# Patient Record
Sex: Female | Born: 1955
Health system: Southern US, Community
[De-identification: ages and names within clinical notes are randomized; demographics above are authoritative.]

## PROBLEM LIST (undated history)

## (undated) DIAGNOSIS — E039 Hypothyroidism, unspecified: Secondary | ICD-10-CM

## (undated) DIAGNOSIS — H548 Legal blindness, as defined in USA: Secondary | ICD-10-CM

## (undated) DIAGNOSIS — N189 Chronic kidney disease, unspecified: Secondary | ICD-10-CM

## (undated) DIAGNOSIS — R7303 Prediabetes: Secondary | ICD-10-CM

## (undated) DIAGNOSIS — I890 Lymphedema, not elsewhere classified: Secondary | ICD-10-CM

## (undated) DIAGNOSIS — H3554 Dystrophies primarily involving the retinal pigment epithelium: Secondary | ICD-10-CM

## (undated) DIAGNOSIS — M1990 Unspecified osteoarthritis, unspecified site: Secondary | ICD-10-CM

## (undated) DIAGNOSIS — E785 Hyperlipidemia, unspecified: Secondary | ICD-10-CM

## (undated) DIAGNOSIS — I10 Essential (primary) hypertension: Secondary | ICD-10-CM

## (undated) HISTORY — PX: BREAST BIOPSY: SHX20

---

## 2007-01-03 ENCOUNTER — Ambulatory Visit: Payer: Self-pay | Admitting: Gastroenterology

## 2011-01-13 ENCOUNTER — Ambulatory Visit: Payer: Self-pay | Admitting: Obstetrics and Gynecology

## 2012-01-19 ENCOUNTER — Ambulatory Visit: Payer: Self-pay | Admitting: Obstetrics and Gynecology

## 2013-01-22 ENCOUNTER — Ambulatory Visit: Payer: Self-pay | Admitting: Obstetrics and Gynecology

## 2013-05-27 DIAGNOSIS — E039 Hypothyroidism, unspecified: Secondary | ICD-10-CM | POA: Insufficient documentation

## 2013-05-27 DIAGNOSIS — E1169 Type 2 diabetes mellitus with other specified complication: Secondary | ICD-10-CM | POA: Insufficient documentation

## 2013-07-24 DIAGNOSIS — I890 Lymphedema, not elsewhere classified: Secondary | ICD-10-CM | POA: Insufficient documentation

## 2014-01-23 ENCOUNTER — Ambulatory Visit: Payer: Self-pay | Admitting: Obstetrics and Gynecology

## 2014-07-28 DIAGNOSIS — E119 Type 2 diabetes mellitus without complications: Secondary | ICD-10-CM | POA: Diagnosis not present

## 2014-07-28 DIAGNOSIS — E782 Mixed hyperlipidemia: Secondary | ICD-10-CM | POA: Diagnosis not present

## 2014-07-28 DIAGNOSIS — Z23 Encounter for immunization: Secondary | ICD-10-CM | POA: Diagnosis not present

## 2014-07-28 DIAGNOSIS — Z79899 Other long term (current) drug therapy: Secondary | ICD-10-CM | POA: Diagnosis not present

## 2014-07-28 DIAGNOSIS — E039 Hypothyroidism, unspecified: Secondary | ICD-10-CM | POA: Diagnosis not present

## 2014-08-26 DIAGNOSIS — I10 Essential (primary) hypertension: Secondary | ICD-10-CM | POA: Diagnosis not present

## 2014-08-26 DIAGNOSIS — N183 Chronic kidney disease, stage 3 (moderate): Secondary | ICD-10-CM | POA: Diagnosis not present

## 2014-08-26 DIAGNOSIS — E119 Type 2 diabetes mellitus without complications: Secondary | ICD-10-CM | POA: Diagnosis not present

## 2014-11-17 DIAGNOSIS — N183 Chronic kidney disease, stage 3 (moderate): Secondary | ICD-10-CM | POA: Diagnosis not present

## 2014-11-17 DIAGNOSIS — E1122 Type 2 diabetes mellitus with diabetic chronic kidney disease: Secondary | ICD-10-CM | POA: Diagnosis not present

## 2014-11-17 DIAGNOSIS — I890 Lymphedema, not elsewhere classified: Secondary | ICD-10-CM | POA: Diagnosis not present

## 2014-11-17 DIAGNOSIS — I10 Essential (primary) hypertension: Secondary | ICD-10-CM | POA: Diagnosis not present

## 2015-01-13 ENCOUNTER — Other Ambulatory Visit: Payer: Self-pay | Admitting: Obstetrics and Gynecology

## 2015-01-13 DIAGNOSIS — Z1211 Encounter for screening for malignant neoplasm of colon: Secondary | ICD-10-CM | POA: Diagnosis not present

## 2015-01-13 DIAGNOSIS — Z1231 Encounter for screening mammogram for malignant neoplasm of breast: Secondary | ICD-10-CM

## 2015-01-13 DIAGNOSIS — Z01419 Encounter for gynecological examination (general) (routine) without abnormal findings: Secondary | ICD-10-CM | POA: Diagnosis not present

## 2015-01-19 DIAGNOSIS — Z780 Asymptomatic menopausal state: Secondary | ICD-10-CM | POA: Diagnosis not present

## 2015-01-27 DIAGNOSIS — E039 Hypothyroidism, unspecified: Secondary | ICD-10-CM | POA: Diagnosis not present

## 2015-01-27 DIAGNOSIS — I10 Essential (primary) hypertension: Secondary | ICD-10-CM | POA: Diagnosis not present

## 2015-01-27 DIAGNOSIS — Z79899 Other long term (current) drug therapy: Secondary | ICD-10-CM | POA: Diagnosis not present

## 2015-01-27 DIAGNOSIS — N183 Chronic kidney disease, stage 3 (moderate): Secondary | ICD-10-CM | POA: Diagnosis not present

## 2015-01-27 DIAGNOSIS — E1122 Type 2 diabetes mellitus with diabetic chronic kidney disease: Secondary | ICD-10-CM | POA: Insufficient documentation

## 2015-01-27 DIAGNOSIS — Z23 Encounter for immunization: Secondary | ICD-10-CM | POA: Diagnosis not present

## 2015-01-27 DIAGNOSIS — E782 Mixed hyperlipidemia: Secondary | ICD-10-CM | POA: Diagnosis not present

## 2015-01-28 ENCOUNTER — Ambulatory Visit
Admission: RE | Admit: 2015-01-28 | Discharge: 2015-01-28 | Disposition: A | Discharge status: Home / Self Care | Payer: Commercial Managed Care - HMO | Source: Ambulatory Visit | Attending: Obstetrics and Gynecology | Admitting: Obstetrics and Gynecology

## 2015-01-28 ENCOUNTER — Ambulatory Visit: Payer: Self-pay

## 2015-01-28 DIAGNOSIS — Z1231 Encounter for screening mammogram for malignant neoplasm of breast: Secondary | ICD-10-CM | POA: Diagnosis not present

## 2015-02-02 DIAGNOSIS — E782 Mixed hyperlipidemia: Secondary | ICD-10-CM | POA: Diagnosis not present

## 2015-02-02 DIAGNOSIS — I509 Heart failure, unspecified: Secondary | ICD-10-CM | POA: Diagnosis not present

## 2015-02-02 DIAGNOSIS — E1169 Type 2 diabetes mellitus with other specified complication: Secondary | ICD-10-CM | POA: Diagnosis not present

## 2015-02-02 DIAGNOSIS — H353290 Exudative age-related macular degeneration, unspecified eye, stage unspecified: Secondary | ICD-10-CM | POA: Diagnosis not present

## 2015-02-02 DIAGNOSIS — Z6832 Body mass index (BMI) 32.0-32.9, adult: Secondary | ICD-10-CM | POA: Diagnosis not present

## 2015-02-02 DIAGNOSIS — E039 Hypothyroidism, unspecified: Secondary | ICD-10-CM | POA: Diagnosis not present

## 2015-02-02 DIAGNOSIS — E669 Obesity, unspecified: Secondary | ICD-10-CM | POA: Diagnosis not present

## 2015-02-02 DIAGNOSIS — Z0000 Encounter for general adult medical examination without abnormal findings: Secondary | ICD-10-CM | POA: Diagnosis not present

## 2015-03-25 DIAGNOSIS — H3554 Dystrophies primarily involving the retinal pigment epithelium: Secondary | ICD-10-CM | POA: Diagnosis not present

## 2015-08-11 DIAGNOSIS — E782 Mixed hyperlipidemia: Secondary | ICD-10-CM | POA: Diagnosis not present

## 2015-08-11 DIAGNOSIS — E039 Hypothyroidism, unspecified: Secondary | ICD-10-CM | POA: Diagnosis not present

## 2015-08-11 DIAGNOSIS — Z79899 Other long term (current) drug therapy: Secondary | ICD-10-CM | POA: Diagnosis not present

## 2015-08-11 DIAGNOSIS — I10 Essential (primary) hypertension: Secondary | ICD-10-CM | POA: Diagnosis not present

## 2015-08-11 DIAGNOSIS — N183 Chronic kidney disease, stage 3 (moderate): Secondary | ICD-10-CM | POA: Diagnosis not present

## 2015-08-11 DIAGNOSIS — E1122 Type 2 diabetes mellitus with diabetic chronic kidney disease: Secondary | ICD-10-CM | POA: Diagnosis not present

## 2016-01-19 ENCOUNTER — Other Ambulatory Visit: Payer: Self-pay | Admitting: Obstetrics and Gynecology

## 2016-01-19 DIAGNOSIS — E781 Pure hyperglyceridemia: Secondary | ICD-10-CM | POA: Diagnosis not present

## 2016-01-19 DIAGNOSIS — Z1211 Encounter for screening for malignant neoplasm of colon: Secondary | ICD-10-CM | POA: Diagnosis not present

## 2016-01-19 DIAGNOSIS — E7800 Pure hypercholesterolemia, unspecified: Secondary | ICD-10-CM | POA: Diagnosis not present

## 2016-01-19 DIAGNOSIS — H547 Unspecified visual loss: Secondary | ICD-10-CM | POA: Diagnosis not present

## 2016-01-19 DIAGNOSIS — Z1231 Encounter for screening mammogram for malignant neoplasm of breast: Secondary | ICD-10-CM

## 2016-01-19 DIAGNOSIS — Z01419 Encounter for gynecological examination (general) (routine) without abnormal findings: Secondary | ICD-10-CM | POA: Diagnosis not present

## 2016-01-19 DIAGNOSIS — E669 Obesity, unspecified: Secondary | ICD-10-CM | POA: Diagnosis not present

## 2016-02-16 ENCOUNTER — Ambulatory Visit
Admission: RE | Admit: 2016-02-16 | Discharge: 2016-02-16 | Disposition: A | Discharge status: Home / Self Care | Payer: Commercial Managed Care - HMO | Source: Ambulatory Visit | Attending: Obstetrics and Gynecology | Admitting: Obstetrics and Gynecology

## 2016-02-16 DIAGNOSIS — Z79899 Other long term (current) drug therapy: Secondary | ICD-10-CM | POA: Diagnosis not present

## 2016-02-16 DIAGNOSIS — N183 Chronic kidney disease, stage 3 (moderate): Secondary | ICD-10-CM | POA: Diagnosis not present

## 2016-02-16 DIAGNOSIS — E782 Mixed hyperlipidemia: Secondary | ICD-10-CM | POA: Diagnosis not present

## 2016-02-16 DIAGNOSIS — Z1231 Encounter for screening mammogram for malignant neoplasm of breast: Secondary | ICD-10-CM | POA: Insufficient documentation

## 2016-02-16 DIAGNOSIS — I10 Essential (primary) hypertension: Secondary | ICD-10-CM | POA: Diagnosis not present

## 2016-02-16 DIAGNOSIS — E1122 Type 2 diabetes mellitus with diabetic chronic kidney disease: Secondary | ICD-10-CM | POA: Diagnosis not present

## 2016-02-16 DIAGNOSIS — E039 Hypothyroidism, unspecified: Secondary | ICD-10-CM | POA: Diagnosis not present

## 2016-08-01 DIAGNOSIS — H353122 Nonexudative age-related macular degeneration, left eye, intermediate dry stage: Secondary | ICD-10-CM | POA: Diagnosis not present

## 2016-08-15 DIAGNOSIS — R748 Abnormal levels of other serum enzymes: Secondary | ICD-10-CM | POA: Diagnosis not present

## 2016-08-15 DIAGNOSIS — I10 Essential (primary) hypertension: Secondary | ICD-10-CM | POA: Diagnosis not present

## 2016-08-15 DIAGNOSIS — E039 Hypothyroidism, unspecified: Secondary | ICD-10-CM | POA: Diagnosis not present

## 2016-08-15 DIAGNOSIS — E782 Mixed hyperlipidemia: Secondary | ICD-10-CM | POA: Diagnosis not present

## 2016-08-15 DIAGNOSIS — Z23 Encounter for immunization: Secondary | ICD-10-CM | POA: Diagnosis not present

## 2016-08-15 DIAGNOSIS — E1122 Type 2 diabetes mellitus with diabetic chronic kidney disease: Secondary | ICD-10-CM | POA: Diagnosis not present

## 2016-08-15 DIAGNOSIS — N183 Chronic kidney disease, stage 3 (moderate): Secondary | ICD-10-CM | POA: Diagnosis not present

## 2016-08-15 DIAGNOSIS — I890 Lymphedema, not elsewhere classified: Secondary | ICD-10-CM | POA: Diagnosis not present

## 2016-08-15 DIAGNOSIS — Z79899 Other long term (current) drug therapy: Secondary | ICD-10-CM | POA: Diagnosis not present

## 2016-09-05 DIAGNOSIS — H353122 Nonexudative age-related macular degeneration, left eye, intermediate dry stage: Secondary | ICD-10-CM | POA: Diagnosis not present

## 2016-11-09 DIAGNOSIS — Z1211 Encounter for screening for malignant neoplasm of colon: Secondary | ICD-10-CM | POA: Diagnosis not present

## 2017-02-10 ENCOUNTER — Encounter: Payer: Self-pay | Admitting: *Deleted

## 2017-02-13 ENCOUNTER — Encounter
Admission: RE | Disposition: A | Discharge status: Home / Self Care | Payer: Self-pay | Source: Ambulatory Visit | Attending: Unknown Physician Specialty

## 2017-02-13 ENCOUNTER — Ambulatory Visit: Payer: Medicare HMO | Admitting: Anesthesiology

## 2017-02-13 ENCOUNTER — Encounter: Payer: Self-pay | Admitting: Anesthesiology

## 2017-02-13 ENCOUNTER — Ambulatory Visit
Admission: RE | Admit: 2017-02-13 | Discharge: 2017-02-13 | Disposition: A | Discharge status: Home / Self Care | Payer: Medicare HMO | Source: Ambulatory Visit | Attending: Unknown Physician Specialty | Admitting: Unknown Physician Specialty

## 2017-02-13 DIAGNOSIS — E039 Hypothyroidism, unspecified: Secondary | ICD-10-CM | POA: Diagnosis not present

## 2017-02-13 DIAGNOSIS — Z7982 Long term (current) use of aspirin: Secondary | ICD-10-CM | POA: Diagnosis not present

## 2017-02-13 DIAGNOSIS — E785 Hyperlipidemia, unspecified: Secondary | ICD-10-CM | POA: Diagnosis not present

## 2017-02-13 DIAGNOSIS — I129 Hypertensive chronic kidney disease with stage 1 through stage 4 chronic kidney disease, or unspecified chronic kidney disease: Secondary | ICD-10-CM | POA: Diagnosis not present

## 2017-02-13 DIAGNOSIS — Z79899 Other long term (current) drug therapy: Secondary | ICD-10-CM | POA: Diagnosis not present

## 2017-02-13 DIAGNOSIS — Z1211 Encounter for screening for malignant neoplasm of colon: Secondary | ICD-10-CM | POA: Diagnosis not present

## 2017-02-13 DIAGNOSIS — I890 Lymphedema, not elsewhere classified: Secondary | ICD-10-CM | POA: Diagnosis not present

## 2017-02-13 DIAGNOSIS — K640 First degree hemorrhoids: Secondary | ICD-10-CM | POA: Diagnosis not present

## 2017-02-13 DIAGNOSIS — H3554 Dystrophies primarily involving the retinal pigment epithelium: Secondary | ICD-10-CM | POA: Diagnosis not present

## 2017-02-13 DIAGNOSIS — N183 Chronic kidney disease, stage 3 (moderate): Secondary | ICD-10-CM | POA: Insufficient documentation

## 2017-02-13 DIAGNOSIS — K648 Other hemorrhoids: Secondary | ICD-10-CM | POA: Diagnosis not present

## 2017-02-13 DIAGNOSIS — E1122 Type 2 diabetes mellitus with diabetic chronic kidney disease: Secondary | ICD-10-CM | POA: Insufficient documentation

## 2017-02-13 DIAGNOSIS — H548 Legal blindness, as defined in USA: Secondary | ICD-10-CM | POA: Diagnosis not present

## 2017-02-13 HISTORY — DX: Legal blindness, as defined in USA: H54.8

## 2017-02-13 HISTORY — DX: Hypothyroidism, unspecified: E03.9

## 2017-02-13 HISTORY — DX: Lymphedema, not elsewhere classified: I89.0

## 2017-02-13 HISTORY — DX: Essential (primary) hypertension: I10

## 2017-02-13 HISTORY — PX: COLONOSCOPY WITH PROPOFOL: SHX5780

## 2017-02-13 HISTORY — DX: Hyperlipidemia, unspecified: E78.5

## 2017-02-13 HISTORY — DX: Prediabetes: R73.03

## 2017-02-13 HISTORY — DX: Chronic kidney disease, unspecified: N18.9

## 2017-02-13 HISTORY — DX: Dystrophies primarily involving the retinal pigment epithelium: H35.54

## 2017-02-13 SURGERY — COLONOSCOPY WITH PROPOFOL
Anesthesia: General

## 2017-02-13 MED ORDER — SODIUM CHLORIDE 0.9 % IV SOLN
INTRAVENOUS | Status: DC
  Administered 2017-02-13: 07:00:00 via INTRAVENOUS

## 2017-02-13 MED ORDER — SODIUM CHLORIDE 0.9 % IV SOLN: INTRAVENOUS | Status: DC

## 2017-02-13 MED ORDER — PROPOFOL 500 MG/50ML IV EMUL
INTRAVENOUS | Status: AC
  Filled 2017-02-13: qty 50

## 2017-02-13 MED ORDER — FENTANYL CITRATE (PF) 100 MCG/2ML IJ SOLN
INTRAMUSCULAR | Status: DC | PRN
  Administered 2017-02-13: 50 ug via INTRAVENOUS

## 2017-02-13 MED ORDER — FENTANYL CITRATE (PF) 100 MCG/2ML IJ SOLN
INTRAMUSCULAR | Status: AC
  Filled 2017-02-13: qty 2

## 2017-02-13 MED ORDER — EPHEDRINE SULFATE 50 MG/ML IJ SOLN
INTRAMUSCULAR | Status: DC | PRN
  Administered 2017-02-13: 10 mg via INTRAVENOUS

## 2017-02-13 MED ORDER — MIDAZOLAM HCL 2 MG/2ML IJ SOLN
INTRAMUSCULAR | Status: AC
  Filled 2017-02-13: qty 2

## 2017-02-13 MED ORDER — MIDAZOLAM HCL 2 MG/2ML IJ SOLN
INTRAMUSCULAR | Status: DC | PRN
  Administered 2017-02-13: 2 mg via INTRAVENOUS

## 2017-02-13 MED ORDER — PROPOFOL 500 MG/50ML IV EMUL
INTRAVENOUS | Status: DC | PRN
  Administered 2017-02-13: 100 ug/kg/min via INTRAVENOUS

## 2017-02-13 NOTE — Anesthesia Postprocedure Evaluation (Signed)
Anesthesia Post Note  Patient: Amy Meyer  Procedure(Meyer) Performed: COLONOSCOPY WITH PROPOFOL (N/A )  Patient location during evaluation: Endoscopy Anesthesia Type: General Level of consciousness: awake and alert Pain management: pain level controlled Vital Signs Assessment: post-procedure vital signs reviewed and stable Respiratory status: spontaneous breathing, nonlabored ventilation, respiratory function stable and patient connected to nasal cannula oxygen Cardiovascular status: blood pressure returned to baseline and stable Postop Assessment: no apparent nausea or vomiting Anesthetic complications: no     Last Vitals:  Vitals:   02/13/17 0818 02/13/17 0828  BP: 138/63 135/62  Pulse: (!) 58 (!) 54  Resp: 17 14  Temp:    SpO2: 100% 99%    Last Pain:  Vitals:   02/13/17 0758  TempSrc: Tympanic                 Amy Meyer

## 2017-02-13 NOTE — Anesthesia Post-op Follow-up Note (Signed)
Anesthesia QCDR form completed.        

## 2017-02-13 NOTE — Anesthesia Preprocedure Evaluation (Signed)
Anesthesia Evaluation  Patient identified by MRN, date of birth, ID band Patient awake    Reviewed: Allergy & Precautions, NPO status , Patient's Chart, lab work & pertinent test results, reviewed documented beta blocker date and time   Airway Mallampati: II  TM Distance: >3 FB     Dental  (+) Chipped   Pulmonary           Cardiovascular hypertension, Pt. on medications      Neuro/Psych    GI/Hepatic   Endo/Other  diabetes, Type 2Hypothyroidism   Renal/GU Renal disease     Musculoskeletal   Abdominal   Peds  Hematology   Anesthesia Other Findings Legally blind.  Reproductive/Obstetrics                             Anesthesia Physical Anesthesia Plan  ASA: III  Anesthesia Plan: General   Post-op Pain Management:    Induction: Intravenous  PONV Risk Score and Plan:   Airway Management Planned:   Additional Equipment:   Intra-op Plan:   Post-operative Plan:   Informed Consent: I have reviewed the patients History and Physical, chart, labs and discussed the procedure including the risks, benefits and alternatives for the proposed anesthesia with the patient or authorized representative who has indicated his/her understanding and acceptance.     Plan Discussed with: CRNA  Anesthesia Plan Comments:         Anesthesia Quick Evaluation

## 2017-02-13 NOTE — Op Note (Signed)
[facility] Gastroenterology Patient Name: Amy Meyer Procedure Date: 02/13/2017 7:29 AM MRN: [id number] Account #: [id number] Date of Birth: 1955-03-07 Admit Type: Outpatient Age: [age] [facility] Gender: Female Note Status: Finalized Procedure:            Colonoscopy Indications:          Screening for colorectal malignant neoplasm Providers:            Scot Junobert T. Elliott, MD Referring MD:         Neomia Dearavid N. Harrington Challengerhies, MD (Referring MD) Medicines:            Propofol per Anesthesia Complications:        No immediate complications. Procedure:            Pre-Anesthesia Assessment:                       - After reviewing the risks and benefits, the patient                        was deemed in satisfactory condition to undergo the                        procedure.                       After obtaining informed consent, the colonoscope was                        passed under direct vision. Throughout the procedure,                        the patient's blood pressure, pulse, and oxygen                        saturations were monitored continuously. The                        Colonoscope was introduced through the anus and                        advanced to the the cecum, identified by appendiceal                        orifice and ileocecal valve. The colonoscopy was                        performed without difficulty. The patient tolerated the                        procedure well. The quality of the bowel preparation                        was excellent. Findings:      Internal hemorrhoids were found during endoscopy. The hemorrhoids were       small and Grade I (internal hemorrhoids that do not prolapse).      The exam was otherwise without abnormality. Prep was excellent. Impression:           - Internal hemorrhoids.                       -  The examination was otherwise normal.                       - No specimens collected. Recommendation:       - Repeat  colonoscopy in 10 years for screening purposes. Procedure Code(s):    --- Professional ---                       [phone], Colonoscopy, flexible; diagnostic, including                        collection of specimen(s) by brushing or washing, when                        performed (separate procedure) Diagnosis Code(s):    --- Professional ---                       Z12.11, Encounter for screening for malignant neoplasm                        of colon                       K64.0, First degree hemorrhoids CPT copyright 2016 American Medical Association. All rights reserved. The codes documented in this report are preliminary and upon coder review may  be revised to meet current compliance requirements. Scot Junobert T Elliott, MD 02/13/2017 7:56:32 AM This report has been signed electronically. Number of Addenda: 0 Note Initiated On: 02/13/2017 7:29 AM Scope Withdrawal Time: 0 hours 9 minutes 19 seconds  Total Procedure Duration: 0 hours 17 minutes 27 seconds       [facility]

## 2017-02-13 NOTE — Anesthesia Procedure Notes (Signed)
Performed by: Tonia Ghentook-Martin, Verner Kopischke Pre-anesthesia Checklist: Emergency Drugs available, Patient identified, Patient being monitored, Suction available and Timeout performed Patient Re-evaluated:Patient Re-evaluated prior to induction Oxygen Delivery Method: Nasal cannula Preoxygenation: Pre-oxygenation with 100% oxygen Induction Type: IV induction Placement Confirmation: CO2 detector and positive ETCO2

## 2017-02-13 NOTE — Transfer of Care (Signed)
Immediate Anesthesia Transfer of Care Note  Patient: Amy Meyer  Procedure(s) Performed: COLONOSCOPY WITH PROPOFOL (N/A )  Patient Location: PACU  Anesthesia Type:General  Level of Consciousness: awake and sedated  Airway & Oxygen Therapy: Patient Spontanous Breathing and Patient connected to nasal cannula oxygen  Post-op Assessment: Report given to RN and Post -op Vital signs reviewed and stable  Post vital signs: Reviewed and stable  Last Vitals:  Vitals:   02/13/17 0654  BP: (!) 144/61  Pulse: 73  Resp: 20  Temp: (!) 35.6 C  SpO2: 99%    Last Pain:  Vitals:   02/13/17 0654  TempSrc: Tympanic         Complications: No apparent anesthesia complications

## 2017-02-13 NOTE — H&P (Signed)
   Primary Care Physician:  Mickey Farberhies, David, MD Primary Gastroenterologist:  Dr. Mechele CollinElliott  Pre-Procedure History & Physical: HPI:  Amy Meyer is a 61 y.o. female is here for an colonoscopy.   Past Medical History:  Diagnosis Date  . Adult onset vitelliform macular dystrophy   . Chronic kidney disease    stage 3 chronic kidney disease  . Hyperlipidemia   . Hypertension   . Hypothyroidism   . Legally blind   . Lymphedema   . Pre-diabetes     Past Surgical History:  Procedure Laterality Date  . BREAST BIOPSY Right    bx/clip-neg    Prior to Admission medications   Medication Sig Start Date End Date Taking? Authorizing Provider  aspirin 325 MG EC tablet Take 325 mg by mouth daily.   Yes [provider]  atorvastatin (LIPITOR) 40 MG tablet Take 40 mg by mouth daily.   Yes [provider]  hydrochlorothiazide (HYDRODIURIL) 25 MG tablet Take 25 mg by mouth daily.   Yes [provider]  levothyroxine (SYNTHROID, LEVOTHROID) 88 MCG tablet Take 88 mcg by mouth daily before breakfast.   Yes [provider]    Allergies as of 02/01/2017  . (Not on File)    Family History  Problem Relation Age of Onset  . Breast cancer Paternal Aunt 2960    Social History   Socioeconomic History  . Marital status: Married    Spouse name: Not on file  . Number of children: Not on file  . Years of education: Not on file  . Highest education level: Not on file  Social Needs  . Financial resource strain: Not on file  . Food insecurity - worry: Not on file  . Food insecurity - inability: Not on file  . Transportation needs - medical: Not on file  . Transportation needs - non-medical: Not on file  Occupational History  . Not on file  Tobacco Use  . Smoking status: Never Smoker  . Smokeless tobacco: Never Used  Substance and Sexual Activity  . Alcohol use: No    Frequency: Never  . Drug use: No  . Sexual activity: Not on file  Other Topics  Concern  . Not on file  Social History Narrative  . Not on file    Review of Systems: See HPI, otherwise negative ROS  Physical Exam: BP (!) 144/61   Pulse 73   Temp (!) 96.1 F (35.6 C) (Tympanic)   Resp 20   Ht 5\' 5"  (1.651 m)   Wt 95.3 kg (210 lb)   SpO2 99%   BMI 34.95 kg/m  General:   Alert,  pleasant and cooperative in NAD Head:  Normocephalic and atraumatic. Neck:  Supple; no masses or thyromegaly. Lungs:  Clear throughout to auscultation.    Heart:  Regular rate and rhythm. Abdomen:  Soft, nontender and nondistended. Normal bowel sounds, without guarding, and without rebound.   Neurologic:  Alert and  oriented x4;  grossly normal neurologically.  Impression/Plan: Amy Meyer is here for an colonoscopy to be performed for screening colon exam.  Risks, benefits, limitations, and alternatives regarding  colonoscopy have been reviewed with the patient.  Questions have been answered.  All parties agreeable.   [name], Nijae Doyel, MD  02/13/2017, 7:22 AM

## 2017-02-15 ENCOUNTER — Encounter: Payer: Self-pay | Admitting: Unknown Physician Specialty

## 2017-02-16 DIAGNOSIS — H3554 Dystrophies primarily involving the retinal pigment epithelium: Secondary | ICD-10-CM | POA: Diagnosis not present

## 2017-02-16 DIAGNOSIS — E039 Hypothyroidism, unspecified: Secondary | ICD-10-CM | POA: Diagnosis not present

## 2017-02-16 DIAGNOSIS — E782 Mixed hyperlipidemia: Secondary | ICD-10-CM | POA: Diagnosis not present

## 2017-02-16 DIAGNOSIS — Z79899 Other long term (current) drug therapy: Secondary | ICD-10-CM | POA: Diagnosis not present

## 2017-02-16 DIAGNOSIS — N183 Chronic kidney disease, stage 3 (moderate): Secondary | ICD-10-CM | POA: Diagnosis not present

## 2017-02-16 DIAGNOSIS — I10 Essential (primary) hypertension: Secondary | ICD-10-CM | POA: Diagnosis not present

## 2017-02-16 DIAGNOSIS — E1122 Type 2 diabetes mellitus with diabetic chronic kidney disease: Secondary | ICD-10-CM | POA: Diagnosis not present

## 2017-02-16 DIAGNOSIS — Z0000 Encounter for general adult medical examination without abnormal findings: Secondary | ICD-10-CM | POA: Diagnosis not present

## 2017-08-18 DIAGNOSIS — E782 Mixed hyperlipidemia: Secondary | ICD-10-CM | POA: Diagnosis not present

## 2017-08-18 DIAGNOSIS — E1122 Type 2 diabetes mellitus with diabetic chronic kidney disease: Secondary | ICD-10-CM | POA: Diagnosis not present

## 2017-08-18 DIAGNOSIS — Z23 Encounter for immunization: Secondary | ICD-10-CM | POA: Diagnosis not present

## 2017-08-18 DIAGNOSIS — H3554 Dystrophies primarily involving the retinal pigment epithelium: Secondary | ICD-10-CM | POA: Diagnosis not present

## 2017-08-18 DIAGNOSIS — N183 Chronic kidney disease, stage 3 (moderate): Secondary | ICD-10-CM | POA: Diagnosis not present

## 2017-08-18 DIAGNOSIS — E039 Hypothyroidism, unspecified: Secondary | ICD-10-CM | POA: Diagnosis not present

## 2017-08-18 DIAGNOSIS — Z79899 Other long term (current) drug therapy: Secondary | ICD-10-CM | POA: Diagnosis not present

## 2017-08-18 DIAGNOSIS — I10 Essential (primary) hypertension: Secondary | ICD-10-CM | POA: Diagnosis not present

## 2017-08-18 DIAGNOSIS — I890 Lymphedema, not elsewhere classified: Secondary | ICD-10-CM | POA: Diagnosis not present

## 2017-10-31 ENCOUNTER — Other Ambulatory Visit: Payer: Self-pay | Admitting: Obstetrics and Gynecology

## 2017-10-31 DIAGNOSIS — E669 Obesity, unspecified: Secondary | ICD-10-CM | POA: Diagnosis not present

## 2017-10-31 DIAGNOSIS — Z1231 Encounter for screening mammogram for malignant neoplasm of breast: Secondary | ICD-10-CM

## 2017-10-31 DIAGNOSIS — Z01419 Encounter for gynecological examination (general) (routine) without abnormal findings: Secondary | ICD-10-CM | POA: Diagnosis not present

## 2017-10-31 DIAGNOSIS — Z124 Encounter for screening for malignant neoplasm of cervix: Secondary | ICD-10-CM | POA: Diagnosis not present

## 2017-10-31 DIAGNOSIS — Z1211 Encounter for screening for malignant neoplasm of colon: Secondary | ICD-10-CM | POA: Diagnosis not present

## 2017-11-08 ENCOUNTER — Ambulatory Visit: Payer: Medicare HMO

## 2017-11-09 ENCOUNTER — Ambulatory Visit
Admission: RE | Admit: 2017-11-09 | Discharge: 2017-11-09 | Disposition: A | Discharge status: Home / Self Care | Payer: Medicare HMO | Source: Ambulatory Visit | Attending: Obstetrics and Gynecology | Admitting: Obstetrics and Gynecology

## 2017-11-09 DIAGNOSIS — Z1231 Encounter for screening mammogram for malignant neoplasm of breast: Secondary | ICD-10-CM | POA: Diagnosis not present

## 2017-11-16 DIAGNOSIS — E119 Type 2 diabetes mellitus without complications: Secondary | ICD-10-CM | POA: Diagnosis not present

## 2018-02-19 DIAGNOSIS — E785 Hyperlipidemia, unspecified: Secondary | ICD-10-CM | POA: Diagnosis not present

## 2018-02-19 DIAGNOSIS — E039 Hypothyroidism, unspecified: Secondary | ICD-10-CM | POA: Diagnosis not present

## 2018-02-19 DIAGNOSIS — Z79899 Other long term (current) drug therapy: Secondary | ICD-10-CM | POA: Diagnosis not present

## 2018-02-19 DIAGNOSIS — I10 Essential (primary) hypertension: Secondary | ICD-10-CM | POA: Diagnosis not present

## 2018-02-19 DIAGNOSIS — I890 Lymphedema, not elsewhere classified: Secondary | ICD-10-CM | POA: Diagnosis not present

## 2018-02-19 DIAGNOSIS — E1169 Type 2 diabetes mellitus with other specified complication: Secondary | ICD-10-CM | POA: Diagnosis not present

## 2018-02-19 DIAGNOSIS — N183 Chronic kidney disease, stage 3 (moderate): Secondary | ICD-10-CM | POA: Diagnosis not present

## 2018-02-19 DIAGNOSIS — E559 Vitamin D deficiency, unspecified: Secondary | ICD-10-CM | POA: Insufficient documentation

## 2018-02-19 DIAGNOSIS — E1122 Type 2 diabetes mellitus with diabetic chronic kidney disease: Secondary | ICD-10-CM | POA: Diagnosis not present

## 2018-02-19 DIAGNOSIS — E1159 Type 2 diabetes mellitus with other circulatory complications: Secondary | ICD-10-CM | POA: Diagnosis not present

## 2018-08-20 DIAGNOSIS — E559 Vitamin D deficiency, unspecified: Secondary | ICD-10-CM | POA: Diagnosis not present

## 2018-08-20 DIAGNOSIS — E785 Hyperlipidemia, unspecified: Secondary | ICD-10-CM | POA: Diagnosis not present

## 2018-08-20 DIAGNOSIS — M5416 Radiculopathy, lumbar region: Secondary | ICD-10-CM | POA: Diagnosis not present

## 2018-08-20 DIAGNOSIS — N183 Chronic kidney disease, stage 3 (moderate): Secondary | ICD-10-CM | POA: Diagnosis not present

## 2018-08-20 DIAGNOSIS — Z79899 Other long term (current) drug therapy: Secondary | ICD-10-CM | POA: Diagnosis not present

## 2018-08-20 DIAGNOSIS — Z0000 Encounter for general adult medical examination without abnormal findings: Secondary | ICD-10-CM | POA: Diagnosis not present

## 2018-08-20 DIAGNOSIS — E039 Hypothyroidism, unspecified: Secondary | ICD-10-CM | POA: Diagnosis not present

## 2018-08-20 DIAGNOSIS — E1159 Type 2 diabetes mellitus with other circulatory complications: Secondary | ICD-10-CM | POA: Diagnosis not present

## 2018-08-20 DIAGNOSIS — E1122 Type 2 diabetes mellitus with diabetic chronic kidney disease: Secondary | ICD-10-CM | POA: Diagnosis not present

## 2018-08-20 DIAGNOSIS — E1169 Type 2 diabetes mellitus with other specified complication: Secondary | ICD-10-CM | POA: Diagnosis not present

## 2018-08-29 DIAGNOSIS — E1122 Type 2 diabetes mellitus with diabetic chronic kidney disease: Secondary | ICD-10-CM | POA: Diagnosis not present

## 2018-08-29 DIAGNOSIS — G8929 Other chronic pain: Secondary | ICD-10-CM | POA: Diagnosis not present

## 2018-08-29 DIAGNOSIS — M545 Low back pain: Secondary | ICD-10-CM | POA: Diagnosis not present

## 2018-08-29 DIAGNOSIS — N183 Chronic kidney disease, stage 3 (moderate): Secondary | ICD-10-CM | POA: Diagnosis not present

## 2018-08-29 DIAGNOSIS — M1612 Unilateral primary osteoarthritis, left hip: Secondary | ICD-10-CM | POA: Diagnosis not present

## 2018-08-29 DIAGNOSIS — M5442 Lumbago with sciatica, left side: Secondary | ICD-10-CM | POA: Diagnosis not present

## 2018-08-29 DIAGNOSIS — E669 Obesity, unspecified: Secondary | ICD-10-CM | POA: Diagnosis not present

## 2018-08-30 DIAGNOSIS — M25552 Pain in left hip: Secondary | ICD-10-CM | POA: Diagnosis not present

## 2018-08-30 DIAGNOSIS — M1612 Unilateral primary osteoarthritis, left hip: Secondary | ICD-10-CM | POA: Diagnosis not present

## 2018-11-06 DIAGNOSIS — Z124 Encounter for screening for malignant neoplasm of cervix: Secondary | ICD-10-CM | POA: Diagnosis not present

## 2018-11-06 DIAGNOSIS — Z1231 Encounter for screening mammogram for malignant neoplasm of breast: Secondary | ICD-10-CM | POA: Diagnosis not present

## 2018-11-12 ENCOUNTER — Other Ambulatory Visit: Payer: Self-pay | Admitting: Obstetrics and Gynecology

## 2018-11-12 DIAGNOSIS — Z1231 Encounter for screening mammogram for malignant neoplasm of breast: Secondary | ICD-10-CM

## 2018-11-19 DIAGNOSIS — E119 Type 2 diabetes mellitus without complications: Secondary | ICD-10-CM | POA: Diagnosis not present

## 2018-11-20 ENCOUNTER — Ambulatory Visit
Admission: RE | Admit: 2018-11-20 | Discharge: 2018-11-20 | Disposition: A | Discharge status: Home / Self Care | Payer: Medicare HMO | Source: Ambulatory Visit | Attending: Obstetrics and Gynecology | Admitting: Obstetrics and Gynecology

## 2018-11-20 ENCOUNTER — Other Ambulatory Visit: Payer: Self-pay

## 2018-11-20 DIAGNOSIS — Z1231 Encounter for screening mammogram for malignant neoplasm of breast: Secondary | ICD-10-CM | POA: Diagnosis not present

## 2019-02-21 DIAGNOSIS — E1122 Type 2 diabetes mellitus with diabetic chronic kidney disease: Secondary | ICD-10-CM | POA: Diagnosis not present

## 2019-02-21 DIAGNOSIS — E039 Hypothyroidism, unspecified: Secondary | ICD-10-CM | POA: Diagnosis not present

## 2019-02-21 DIAGNOSIS — E559 Vitamin D deficiency, unspecified: Secondary | ICD-10-CM | POA: Diagnosis not present

## 2019-02-21 DIAGNOSIS — I890 Lymphedema, not elsewhere classified: Secondary | ICD-10-CM | POA: Diagnosis not present

## 2019-02-21 DIAGNOSIS — E785 Hyperlipidemia, unspecified: Secondary | ICD-10-CM | POA: Diagnosis not present

## 2019-02-21 DIAGNOSIS — E1169 Type 2 diabetes mellitus with other specified complication: Secondary | ICD-10-CM | POA: Diagnosis not present

## 2019-02-21 DIAGNOSIS — E1159 Type 2 diabetes mellitus with other circulatory complications: Secondary | ICD-10-CM | POA: Diagnosis not present

## 2019-02-21 DIAGNOSIS — Z79899 Other long term (current) drug therapy: Secondary | ICD-10-CM | POA: Diagnosis not present

## 2019-02-21 DIAGNOSIS — N183 Chronic kidney disease, stage 3 unspecified: Secondary | ICD-10-CM | POA: Diagnosis not present

## 2019-08-22 DIAGNOSIS — R748 Abnormal levels of other serum enzymes: Secondary | ICD-10-CM | POA: Diagnosis not present

## 2019-08-22 DIAGNOSIS — N183 Chronic kidney disease, stage 3 unspecified: Secondary | ICD-10-CM | POA: Diagnosis not present

## 2019-08-22 DIAGNOSIS — E785 Hyperlipidemia, unspecified: Secondary | ICD-10-CM | POA: Diagnosis not present

## 2019-08-22 DIAGNOSIS — I152 Hypertension secondary to endocrine disorders: Secondary | ICD-10-CM | POA: Diagnosis not present

## 2019-08-22 DIAGNOSIS — E1159 Type 2 diabetes mellitus with other circulatory complications: Secondary | ICD-10-CM | POA: Diagnosis not present

## 2019-08-22 DIAGNOSIS — E559 Vitamin D deficiency, unspecified: Secondary | ICD-10-CM | POA: Diagnosis not present

## 2019-08-22 DIAGNOSIS — Z79899 Other long term (current) drug therapy: Secondary | ICD-10-CM | POA: Diagnosis not present

## 2019-08-22 DIAGNOSIS — I890 Lymphedema, not elsewhere classified: Secondary | ICD-10-CM | POA: Diagnosis not present

## 2019-08-22 DIAGNOSIS — E1122 Type 2 diabetes mellitus with diabetic chronic kidney disease: Secondary | ICD-10-CM | POA: Diagnosis not present

## 2019-08-22 DIAGNOSIS — E1169 Type 2 diabetes mellitus with other specified complication: Secondary | ICD-10-CM | POA: Diagnosis not present

## 2019-08-22 DIAGNOSIS — E039 Hypothyroidism, unspecified: Secondary | ICD-10-CM | POA: Diagnosis not present

## 2019-08-22 DIAGNOSIS — Z0000 Encounter for general adult medical examination without abnormal findings: Secondary | ICD-10-CM | POA: Diagnosis not present

## 2019-09-24 DIAGNOSIS — H353122 Nonexudative age-related macular degeneration, left eye, intermediate dry stage: Secondary | ICD-10-CM | POA: Diagnosis not present

## 2019-09-30 DIAGNOSIS — I10 Essential (primary) hypertension: Secondary | ICD-10-CM | POA: Diagnosis not present

## 2019-09-30 DIAGNOSIS — E1122 Type 2 diabetes mellitus with diabetic chronic kidney disease: Secondary | ICD-10-CM | POA: Diagnosis not present

## 2019-09-30 DIAGNOSIS — N1832 Chronic kidney disease, stage 3b: Secondary | ICD-10-CM | POA: Diagnosis not present

## 2019-10-23 ENCOUNTER — Other Ambulatory Visit: Payer: Self-pay | Admitting: Nephrology

## 2019-10-23 DIAGNOSIS — N1832 Chronic kidney disease, stage 3b: Secondary | ICD-10-CM

## 2019-10-23 DIAGNOSIS — E1122 Type 2 diabetes mellitus with diabetic chronic kidney disease: Secondary | ICD-10-CM

## 2019-11-08 ENCOUNTER — Other Ambulatory Visit: Payer: Self-pay

## 2019-11-08 ENCOUNTER — Encounter (INDEPENDENT_AMBULATORY_CARE_PROVIDER_SITE_OTHER): Payer: Self-pay

## 2019-11-08 ENCOUNTER — Ambulatory Visit
Admission: RE | Admit: 2019-11-08 | Discharge: 2019-11-08 | Disposition: A | Discharge status: Home / Self Care | Payer: Medicare HMO | Source: Ambulatory Visit | Attending: Nephrology | Admitting: Nephrology

## 2019-11-08 DIAGNOSIS — E1122 Type 2 diabetes mellitus with diabetic chronic kidney disease: Secondary | ICD-10-CM | POA: Insufficient documentation

## 2019-11-08 DIAGNOSIS — N1832 Chronic kidney disease, stage 3b: Secondary | ICD-10-CM

## 2019-11-20 DIAGNOSIS — H3554 Dystrophies primarily involving the retinal pigment epithelium: Secondary | ICD-10-CM | POA: Diagnosis not present

## 2019-11-25 DIAGNOSIS — I10 Essential (primary) hypertension: Secondary | ICD-10-CM | POA: Diagnosis not present

## 2019-11-25 DIAGNOSIS — N182 Chronic kidney disease, stage 2 (mild): Secondary | ICD-10-CM | POA: Diagnosis not present

## 2019-11-25 DIAGNOSIS — E1122 Type 2 diabetes mellitus with diabetic chronic kidney disease: Secondary | ICD-10-CM | POA: Diagnosis not present

## 2019-11-26 DIAGNOSIS — Z124 Encounter for screening for malignant neoplasm of cervix: Secondary | ICD-10-CM | POA: Diagnosis not present

## 2019-11-26 DIAGNOSIS — Z1231 Encounter for screening mammogram for malignant neoplasm of breast: Secondary | ICD-10-CM | POA: Diagnosis not present

## 2019-11-27 ENCOUNTER — Other Ambulatory Visit: Payer: Self-pay | Admitting: Obstetrics and Gynecology

## 2019-11-27 DIAGNOSIS — Z1231 Encounter for screening mammogram for malignant neoplasm of breast: Secondary | ICD-10-CM

## 2019-12-12 ENCOUNTER — Ambulatory Visit
Admission: RE | Admit: 2019-12-12 | Discharge: 2019-12-12 | Disposition: A | Discharge status: Home / Self Care | Payer: Medicare HMO | Source: Ambulatory Visit | Attending: Obstetrics and Gynecology | Admitting: Obstetrics and Gynecology

## 2019-12-12 ENCOUNTER — Other Ambulatory Visit: Payer: Self-pay

## 2019-12-12 DIAGNOSIS — Z1231 Encounter for screening mammogram for malignant neoplasm of breast: Secondary | ICD-10-CM | POA: Diagnosis not present

## 2020-02-24 DIAGNOSIS — E1122 Type 2 diabetes mellitus with diabetic chronic kidney disease: Secondary | ICD-10-CM | POA: Diagnosis not present

## 2020-02-24 DIAGNOSIS — Z79899 Other long term (current) drug therapy: Secondary | ICD-10-CM | POA: Diagnosis not present

## 2020-02-24 DIAGNOSIS — I10 Essential (primary) hypertension: Secondary | ICD-10-CM | POA: Diagnosis not present

## 2020-02-24 DIAGNOSIS — I890 Lymphedema, not elsewhere classified: Secondary | ICD-10-CM | POA: Diagnosis not present

## 2020-02-24 DIAGNOSIS — E559 Vitamin D deficiency, unspecified: Secondary | ICD-10-CM | POA: Diagnosis not present

## 2020-02-24 DIAGNOSIS — N183 Chronic kidney disease, stage 3 unspecified: Secondary | ICD-10-CM | POA: Diagnosis not present

## 2020-02-24 DIAGNOSIS — E039 Hypothyroidism, unspecified: Secondary | ICD-10-CM | POA: Diagnosis not present

## 2020-02-24 DIAGNOSIS — E1169 Type 2 diabetes mellitus with other specified complication: Secondary | ICD-10-CM | POA: Diagnosis not present

## 2020-02-24 DIAGNOSIS — E785 Hyperlipidemia, unspecified: Secondary | ICD-10-CM | POA: Diagnosis not present

## 2020-06-04 DIAGNOSIS — N1832 Chronic kidney disease, stage 3b: Secondary | ICD-10-CM | POA: Diagnosis not present

## 2020-06-04 DIAGNOSIS — E1122 Type 2 diabetes mellitus with diabetic chronic kidney disease: Secondary | ICD-10-CM | POA: Diagnosis not present

## 2020-06-04 DIAGNOSIS — I10 Essential (primary) hypertension: Secondary | ICD-10-CM | POA: Diagnosis not present

## 2020-06-04 DIAGNOSIS — R600 Localized edema: Secondary | ICD-10-CM | POA: Diagnosis not present

## 2020-10-08 DIAGNOSIS — N1831 Chronic kidney disease, stage 3a: Secondary | ICD-10-CM | POA: Diagnosis not present

## 2020-10-08 DIAGNOSIS — E1122 Type 2 diabetes mellitus with diabetic chronic kidney disease: Secondary | ICD-10-CM | POA: Diagnosis not present

## 2020-10-08 DIAGNOSIS — I10 Essential (primary) hypertension: Secondary | ICD-10-CM | POA: Diagnosis not present

## 2020-10-08 DIAGNOSIS — N1832 Chronic kidney disease, stage 3b: Secondary | ICD-10-CM | POA: Diagnosis not present

## 2020-10-08 DIAGNOSIS — R600 Localized edema: Secondary | ICD-10-CM | POA: Diagnosis not present

## 2020-12-02 DIAGNOSIS — H2513 Age-related nuclear cataract, bilateral: Secondary | ICD-10-CM | POA: Diagnosis not present

## 2020-12-21 DIAGNOSIS — Z124 Encounter for screening for malignant neoplasm of cervix: Secondary | ICD-10-CM | POA: Diagnosis not present

## 2020-12-21 DIAGNOSIS — Z01419 Encounter for gynecological examination (general) (routine) without abnormal findings: Secondary | ICD-10-CM | POA: Diagnosis not present

## 2020-12-23 ENCOUNTER — Other Ambulatory Visit: Payer: Self-pay | Admitting: Obstetrics and Gynecology

## 2020-12-23 DIAGNOSIS — Z1231 Encounter for screening mammogram for malignant neoplasm of breast: Secondary | ICD-10-CM

## 2021-01-13 DIAGNOSIS — Z1389 Encounter for screening for other disorder: Secondary | ICD-10-CM | POA: Diagnosis not present

## 2021-01-13 DIAGNOSIS — E039 Hypothyroidism, unspecified: Secondary | ICD-10-CM | POA: Diagnosis not present

## 2021-01-13 DIAGNOSIS — E1122 Type 2 diabetes mellitus with diabetic chronic kidney disease: Secondary | ICD-10-CM | POA: Diagnosis not present

## 2021-01-13 DIAGNOSIS — E785 Hyperlipidemia, unspecified: Secondary | ICD-10-CM | POA: Diagnosis not present

## 2021-01-13 DIAGNOSIS — E1169 Type 2 diabetes mellitus with other specified complication: Secondary | ICD-10-CM | POA: Diagnosis not present

## 2021-01-13 DIAGNOSIS — Z0000 Encounter for general adult medical examination without abnormal findings: Secondary | ICD-10-CM | POA: Diagnosis not present

## 2021-01-13 DIAGNOSIS — N183 Chronic kidney disease, stage 3 unspecified: Secondary | ICD-10-CM | POA: Diagnosis not present

## 2021-01-13 DIAGNOSIS — I129 Hypertensive chronic kidney disease with stage 1 through stage 4 chronic kidney disease, or unspecified chronic kidney disease: Secondary | ICD-10-CM | POA: Diagnosis not present

## 2021-01-14 ENCOUNTER — Other Ambulatory Visit: Payer: Self-pay

## 2021-01-14 ENCOUNTER — Ambulatory Visit
Admission: RE | Admit: 2021-01-14 | Discharge: 2021-01-14 | Disposition: A | Discharge status: Home / Self Care | Payer: Medicare HMO | Source: Ambulatory Visit | Attending: Obstetrics and Gynecology | Admitting: Obstetrics and Gynecology

## 2021-01-14 DIAGNOSIS — Z1231 Encounter for screening mammogram for malignant neoplasm of breast: Secondary | ICD-10-CM | POA: Insufficient documentation

## 2021-06-07 DIAGNOSIS — M1612 Unilateral primary osteoarthritis, left hip: Secondary | ICD-10-CM | POA: Diagnosis not present

## 2021-06-07 DIAGNOSIS — M545 Low back pain, unspecified: Secondary | ICD-10-CM | POA: Diagnosis not present

## 2021-07-15 DIAGNOSIS — E039 Hypothyroidism, unspecified: Secondary | ICD-10-CM | POA: Diagnosis not present

## 2021-07-15 DIAGNOSIS — E1169 Type 2 diabetes mellitus with other specified complication: Secondary | ICD-10-CM | POA: Diagnosis not present

## 2021-07-15 DIAGNOSIS — I129 Hypertensive chronic kidney disease with stage 1 through stage 4 chronic kidney disease, or unspecified chronic kidney disease: Secondary | ICD-10-CM | POA: Diagnosis not present

## 2021-07-15 DIAGNOSIS — E785 Hyperlipidemia, unspecified: Secondary | ICD-10-CM | POA: Diagnosis not present

## 2021-07-15 DIAGNOSIS — M25552 Pain in left hip: Secondary | ICD-10-CM | POA: Diagnosis not present

## 2021-07-15 DIAGNOSIS — E1122 Type 2 diabetes mellitus with diabetic chronic kidney disease: Secondary | ICD-10-CM | POA: Diagnosis not present

## 2021-07-15 DIAGNOSIS — N183 Chronic kidney disease, stage 3 unspecified: Secondary | ICD-10-CM | POA: Diagnosis not present

## 2021-07-16 DIAGNOSIS — M1612 Unilateral primary osteoarthritis, left hip: Secondary | ICD-10-CM | POA: Diagnosis not present

## 2021-07-18 DIAGNOSIS — M1612 Unilateral primary osteoarthritis, left hip: Secondary | ICD-10-CM | POA: Insufficient documentation

## 2021-08-16 DIAGNOSIS — M1990 Unspecified osteoarthritis, unspecified site: Secondary | ICD-10-CM | POA: Diagnosis not present

## 2021-08-16 DIAGNOSIS — R21 Rash and other nonspecific skin eruption: Secondary | ICD-10-CM | POA: Diagnosis not present

## 2021-08-24 DIAGNOSIS — L508 Other urticaria: Secondary | ICD-10-CM | POA: Diagnosis not present

## 2021-09-05 NOTE — Discharge Instructions (Signed)
Instructions after Total Hip Replacement     Amy Meyer P. Altus Zaino, Jr., M.D.     Dept. of Orthopaedics & Sports Medicine  [facility]  [address]  [place],   [address]  Phone: [phone]   Fax: [phone]    DIET: . Drink plenty of non-alcoholic fluids. . Resume your normal diet. Include foods high in fiber.  ACTIVITY:  . You may use crutches or a walker with weight-bearing as tolerated, unless instructed otherwise. . You may be weaned off of the walker or crutches by your Physical Therapist.  . Do NOT reach below the level of your knees or cross your legs until allowed.    . Continue doing gentle exercises. Exercising will reduce the pain and swelling, increase motion, and prevent muscle weakness.   . Please continue to use the TED compression stockings for 6 weeks. You may remove the stockings at night, but should reapply them in the morning. . Do not drive or operate any equipment until instructed.  WOUND CARE:  . Continue to use ice packs periodically to reduce pain and swelling. . Keep the incision clean and dry. . You may bathe or shower after the staples are removed at the first office visit following surgery.  MEDICATIONS: . You may resume your regular medications. . Please take the pain medication as prescribed on the medication. . Do not take pain medication on an empty stomach. . You have been given a prescription for a blood thinner to prevent blood clots. Please take the medication as instructed. (NOTE: After completing a 2 week course of Lovenox, take one Enteric-coated aspirin once a day.) . Pain medications and iron supplements can cause constipation. Use a stool softener (Senokot or Colace) on a daily basis and a laxative (dulcolax or miralax) as needed. . Do not drive or drink alcoholic beverages when taking pain medications.  CALL THE OFFICE FOR: . Temperature above 101 degrees . Excessive bleeding or drainage on the dressing. . Excessive  swelling, coldness, or paleness of the toes. . Persistent nausea and vomiting.  FOLLOW-UP:  . You should have an appointment to return to the office in 6 weeks after surgery. . Arrangements have been made for continuation of Physical Therapy (either home therapy or outpatient therapy).     [facility] Department Directory         [email or web address]       [email or web address]          Cardiology  Appointments: [place] - [phone] [place] - [phone]  Endocrinology  Appointments: [place] - [phone] [place] - [phone]  Gastroenterology  Appointments: [place] - [phone] [place] - [phone]        General Surgery   Appointments: [place] - [phone]  Internal Medicine/Family Medicine  Appointments: [place] - [phone] [place] - [phone] [place] - [phone]  Metabolic and Weigh Loss Surgery  Appointments: [place] - [phone]        Neurology  Appointments: [place] - [phone] [place] - [phone]  Neurosurgery  Appointments: [place] - [phone]  Obstetrics & Gynecology  Appointments: [place] - [phone] [place] - [phone]        Pediatrics  Appointments: [place] - [phone] [place] - [phone]  Physiatry  Appointments: [place] -[phone]  Physical Therapy  Appointments: [place] - [phone] [place] - [phone]        Podiatry  Appointments: [place] - [phone] [place] - [phone]  Pulmonology  Appointments: [place] - [phone]  Rheumatology  Appointments: [place] - [phone]        [place] Location: [facility]  [address] [place], [place]  [address]  [place] Location: [facility] [address] [place], [place]  [address]  [place] Location: [facility] [address] [place], [place]  [address]    

## 2021-09-07 ENCOUNTER — Encounter
Admission: RE | Admit: 2021-09-07 | Discharge: 2021-09-07 | Disposition: A | Discharge status: Home / Self Care | Payer: Medicare HMO | Source: Ambulatory Visit | Attending: Orthopedic Surgery | Admitting: Orthopedic Surgery

## 2021-09-07 ENCOUNTER — Other Ambulatory Visit: Payer: Self-pay

## 2021-09-07 VITALS — BP 161/71 | HR 76 | Resp 18 | Ht 63.0 in | Wt 208.6 lb

## 2021-09-07 DIAGNOSIS — M1612 Unilateral primary osteoarthritis, left hip: Secondary | ICD-10-CM | POA: Diagnosis not present

## 2021-09-07 DIAGNOSIS — Z01818 Encounter for other preprocedural examination: Secondary | ICD-10-CM | POA: Diagnosis not present

## 2021-09-07 DIAGNOSIS — E1122 Type 2 diabetes mellitus with diabetic chronic kidney disease: Secondary | ICD-10-CM | POA: Diagnosis not present

## 2021-09-07 DIAGNOSIS — Z01812 Encounter for preprocedural laboratory examination: Secondary | ICD-10-CM

## 2021-09-07 HISTORY — DX: Unspecified osteoarthritis, unspecified site: M19.90

## 2021-09-07 LAB — URINALYSIS, ROUTINE W REFLEX MICROSCOPIC
Bilirubin Urine: NEGATIVE
Glucose, UA: NEGATIVE mg/dL
Ketones, ur: NEGATIVE mg/dL
Leukocytes,Ua: NEGATIVE
Nitrite: NEGATIVE
Protein, ur: NEGATIVE mg/dL
Specific Gravity, Urine: 1.01 (ref 1.005–1.030)
pH: 5 (ref 5.0–8.0)

## 2021-09-07 LAB — SURGICAL PCR SCREEN
MRSA, PCR: NEGATIVE
Staphylococcus aureus: NEGATIVE

## 2021-09-07 LAB — C-REACTIVE PROTEIN: CRP: 1 mg/dL — ABNORMAL HIGH (ref <1.0)

## 2021-09-07 NOTE — Patient Instructions (Addendum)
Your procedure is scheduled on: 09/20/21 - Monday Report to the Registration Desk on the 1st floor of the [facility]. To find out your arrival time, please call [phone] between 1PM - 3PM on: 09/17/21 - Friday If your arrival time is 6:00 am, do not arrive prior to that time as the [facility] entrance doors do not open until 6:00 am.  REMEMBER: Instructions that are not followed completely may result in serious medical risk, up to and including death; or upon the discretion of your surgeon and anesthesiologist your surgery may need to be rescheduled.  Do not eat food after midnight the night before surgery.  No gum chewing, lozengers or hard candies.  You may however, drink CLEAR liquids up to 2 hours before you are scheduled to arrive for your surgery. Do not drink anything within 2 hours of your scheduled arrival time.  Clear liquids include: - water  - apple juice without pulp - gatorade (not RED colors) - black coffee or tea (Do NOT add milk or creamers to the coffee or tea) Do NOT drink anything that is not on this list.  In addition, your doctor has ordered for you to drink the provided  Ensure Pre-Surgery Clear Carbohydrate Drink  Drinking this carbohydrate drink up to two hours before surgery helps to reduce insulin resistance and improve patient outcomes. Please complete drinking 2 hours prior to scheduled arrival time.  TAKE ONLY THESE MEDICATIONS THE MORNING OF SURGERY WITH A SIP OF WATER:  - levothyroxine (SYNTHROID) - atorvastatin (LIPITOR)  - finasteride (PROSCAR) 5 MG tablet  Stop taking Aspirin 325 mg beginning  09/13/21.  One week prior to surgery: Stop Anti-inflammatories (NSAIDS) such as Advil, Aleve, Ibuprofen, Motrin, Naproxen, Naprosyn and Aspirin based products such as Excedrin, Goodys Powder, BC Powder.  Stop ANY OVER THE COUNTER supplements until after surgery.  You may take Tylenol if needed for pain up until the day of surgery.  No Alcohol  for 24 hours before or after surgery.  No Smoking including e-cigarettes for 24 hours prior to surgery.  No chewable tobacco products for at least 6 hours prior to surgery.  No nicotine patches on the day of surgery.  Do not use any "recreational" drugs for at least a week prior to your surgery.  Please be advised that the combination of cocaine and anesthesia may have negative outcomes, up to and including death. If you test positive for cocaine, your surgery will be cancelled.  On the morning of surgery brush your teeth with toothpaste and water, you may rinse your mouth with mouthwash if you wish. Do not swallow any toothpaste or mouthwash.  Use CHG Soap or wipes as directed on instruction sheet.  Do not wear jewelry, make-up, hairpins, clips or nail polish.  Do not wear lotions, powders, or perfumes.   Do not shave body from the neck down 48 hours prior to surgery just in case you cut yourself which could leave a site for infection.  Also, freshly shaved skin may become irritated if using the CHG soap.  Contact lenses, hearing aids and dentures may not be worn into surgery.  Do not bring valuables to the hospital. [facility] is not responsible for any missing/lost belongings or valuables.   Notify your doctor if there is any change in your medical condition (cold, fever, infection).  Wear comfortable clothing (specific to your surgery type) to the hospital.  After surgery, you can help prevent lung complications by doing breathing exercises.  Take deep breaths and cough every 1-2 hours. Your doctor may order a device called an Incentive Spirometer to help you take deep breaths. When coughing or sneezing, hold a pillow firmly against your incision with both hands. This is called "splinting." Doing this helps protect your incision. It also decreases belly discomfort.  If you are being admitted to the hospital overnight, leave your suitcase in the car. After surgery it may be  brought to your room.  If you are being discharged the day of surgery, you will not be allowed to drive home. You will need a responsible adult (18 years or older) to drive you home and stay with you that night.   If you are taking public transportation, you will need to have a responsible adult (18 years or older) with you. Please confirm with your physician that it is acceptable to use public transportation.   Please call the [place] Dept. at [phone] if you have any questions about these instructions.  Surgery Visitation Policy:  Patients undergoing a surgery or procedure may have two family members or support persons with them as long as the person is not COVID-19 positive or experiencing its symptoms.   Inpatient Visitation:    Visiting hours are 7 a.m. to 8 p.m. Up to four visitors are allowed at one time in a patient room, including children. The visitors may rotate out with other people during the day. One designated support person (adult) may remain overnight.

## 2021-09-14 DIAGNOSIS — M1612 Unilateral primary osteoarthritis, left hip: Secondary | ICD-10-CM | POA: Diagnosis not present

## 2021-09-19 MED ORDER — CELECOXIB 200 MG PO CAPS: 400.0000 mg | ORAL_CAPSULE | Freq: Once | ORAL | Status: AC

## 2021-09-19 MED ORDER — LACTATED RINGERS IV SOLN: INTRAVENOUS | Status: DC

## 2021-09-19 MED ORDER — GABAPENTIN 300 MG PO CAPS: 300.0000 mg | ORAL_CAPSULE | Freq: Once | ORAL | Status: AC

## 2021-09-19 MED ORDER — FAMOTIDINE 20 MG PO TABS: 20.0000 mg | ORAL_TABLET | Freq: Once | ORAL | Status: AC

## 2021-09-19 MED ORDER — CEFAZOLIN SODIUM-DEXTROSE 2-4 GM/100ML-% IV SOLN
2.0000 g | INTRAVENOUS | Status: AC
  Administered 2021-09-20: 2 g via INTRAVENOUS

## 2021-09-19 MED ORDER — CHLORHEXIDINE GLUCONATE 0.12 % MT SOLN: 15.0000 mL | Freq: Once | OROMUCOSAL | Status: AC

## 2021-09-19 MED ORDER — CHLORHEXIDINE GLUCONATE 4 % EX LIQD
60.0000 mL | Freq: Once | CUTANEOUS | Status: AC
  Administered 2021-09-20: 4 via TOPICAL

## 2021-09-19 MED ORDER — TRANEXAMIC ACID-NACL 1000-0.7 MG/100ML-% IV SOLN
1000.0000 mg | INTRAVENOUS | Status: AC
  Administered 2021-09-20: 1000 mg via INTRAVENOUS

## 2021-09-19 MED ORDER — DEXAMETHASONE SODIUM PHOSPHATE 10 MG/ML IJ SOLN: 8.0000 mg | Freq: Once | INTRAMUSCULAR | Status: AC

## 2021-09-19 MED ORDER — ORAL CARE MOUTH RINSE: 15.0000 mL | Freq: Once | OROMUCOSAL | Status: AC

## 2021-09-19 NOTE — H&P (Signed)
ORTHOPAEDIC HISTORY & PHYSICAL Amy Meyer, Amy Meyer - 09/14/2021 9:45 AM EDT Formatting of this note is different from the original. [facility] Chief Complaint:   Chief Complaint  Patient presents with  Hip Pain  H & P LEFT HIP   History of Present Illness:   Amy Meyer is a 66 y.o. female that presents to clinic today for her preoperative history and evaluation. Patient presents unaccompanied. The patient is scheduled to undergo a left total hip arthroplasty on 09/20/21 by Dr. Ernest Pine. Her pain began approximately 3 years ago. The pain is located along the left hip and groin. She describes her pain as worse with weightbearing. She denies associated numbness or tingling.   The patient's symptoms have progressed to the point that they decrease her quality of life. The patient has previously undergone conservative treatment including NSAIDS and activity modification without adequate control of her symptoms.  Denies history of lumbar surgery, significant cardiac history, or history of DVT.   Patient will have husband at home to help postoperatively.   Past Medical, Surgical, Family, Social History, Allergies, Medications:   Past Medical History:  Past Medical History:  Diagnosis Date  Best vitelliform macular dystrophy  Chronic acquired lymphedema 07/24/2013  Had been on lymphedema pump per Dr. Wyn Quaker, now off, D/C'd from follow up with him  Controlled type 2 diabetes mellitus with stage 3 chronic kidney disease, without long-term current use of insulin (CMS-HCC) 01/27/2015  Endometriosis  Hyperlipidemia 05/27/2013  Hypertension 07/24/2013  Hypothyroid 05/27/2013  Legally blind  Macular degeneration  Vitamin D deficiency 02/19/2018  25-OH vitamin D level 9.6 on 02/19/2018   Past Surgical History:  Past Surgical History:  Procedure Laterality Date  COLONOSCOPY 01/03/2007  Dr. Algis Downs. Wohl @ [facility] - Nml  COLONOSCOPY 01/03/2007  Normal: CBF  12/2016  BREAST EXCISIONAL BIOPSY Right 2009  COLONOSCOPY 02/13/2017  Int Hemorrhoids: CBF 01/2027   Current Medications:  Current Outpatient Medications  Medication Sig Dispense Refill  acetaminophen (TYLENOL) 500 MG tablet Take 500 mg by mouth every 6 (six) hours as needed for Pain  aspirin 325 MG EC tablet Take 325 mg by mouth once daily.  atorvastatin (LIPITOR) 40 MG tablet Take 1 tablet (40 mg total) by mouth once daily For cholesterol 90 tablet 3  ergocalciferol, vitamin D2, 1,250 mcg (50,000 unit) capsule TAKE 1 CAPSULE EVERY 14 DAYS 6 capsule 1  fexofenadine (ALLEGRA) 180 MG tablet Take 180 mg by mouth once daily  finasteride (PROSCAR) 5 mg tablet Take 2.5 mg by mouth once daily 1/2 TABLET DAILY  hydroCHLOROthiazide (HYDRODIURIL) 25 MG tablet Take 1 tablet (25 mg total) by mouth every morning for fluid and blood pressure 90 tablet 3  levocetirizine (XYZAL) 5 MG tablet Take 5 mg by mouth every evening  levothyroxine (SYNTHROID) 88 MCG tablet Take 1 tablet (88 mcg total) by mouth every morning before breakfast (0630) ON AN EMPTY STOMACH WITH A GLASS OF WATER AT LEAST 30 TO 60 MINUTES BEFORE BREAKFAST 90 tablet 3  triamcinolone 0.1 % cream Apply topically 2 (two) times daily 30 g 0   No current facility-administered medications for this visit.   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  Comment: BA Music Degree- [employer]  Music Director  Tobacco Use  Smoking status: Never  Smokeless tobacco: Never  Vaping Use  Vaping Use: Never used  Substance and Sexual Activity  Alcohol use: Yes  Comment: Rarely  Drug use: No  Sexual activity: Not Currently  Partners: Male  Birth control/protection: Post-menopausal  Social  History Narrative  Lives with husband, father   Family History:  Family History  Problem Relation Age of Onset  Heart disease Mother  myocardial infarction  Heart disease Father  died 06/14/2014  Kidney cancer Father   Review of Systems:   A 10+ ROS was performed, reviewed, and the pertinent orthopaedic findings are documented in the HPI.   Physical Examination:   BP (!) 140/80 (BP Location: Left upper arm, Patient Position: Sitting, BP Cuff Size: Large Adult)  Ht 157.5 cm (5\' 2" )  Wt 97 kg (213 lb 12.8 oz)  BMI 39.10 kg/m   Patient is a well-developed, well-nourished female in no acute distress. Patient has normal mood and affect. Patient is alert and oriented to person, place, and time.   HEENT: Atraumatic, normocephalic. Pupils equal and reactive to light. Extraocular motion intact. Noninjected sclera.  Cardiovascular: Regular rate and rhythm, with no murmurs, rubs, or gallops. Distal pulses palpable.  Respiratory: Lungs clear to auscultation bilaterally.   Left Hip: Pelvic tilt: Negative Limb lengths: Equal with the patient standing Soft tissue swelling: Negative Erythema: Negative Crepitance: Negative Tenderness: Greater trochanter is nontender to palpation. Moderate pain is elicited by axial compression or extremes of rotation. Atrophy: No atrophy. Fair to good hip flexor and abductor strength. Range of Motion: EXT/FLEX: 0/0/90 ADD/ABD: 20/0/20 IR/ER: 0/0/20  Able to plantarflex and flex ankle. Able to flex and extend the toes.  Sensation is intact over the saphenous, lateral cutaneous, superficial fibular, and deep fibular nerve distributions.  Tests Performed/Reviewed:  X-rays  No new radiographs were obtained today. Previous radiographs were reviewed of the left hip and revealed complete loss of femoral acetabular joint space with subchondral sclerosis and significant osteophyte formation noted. No fractures.  Impression:   ICD-10-CM  1. Primary  osteoarthritis of left hip M16.12   Plan:   The patient has end-stage degenerative changes of the left hip. It was explained to the patient that the condition is progressive in nature. Having failed conservative treatment, the patient has elected to proceed with a total joint arthroplasty. The patient will undergo a total joint arthroplasty with Dr. 09-15-1971. The risks of surgery, including blood clot and infection, were discussed with the patient. Measures to reduce these risks, including the use of anticoagulation, perioperative antibiotics, and early ambulation were discussed. The importance of postoperative physical therapy was discussed with the patient. The patient elects to proceed with surgery. The patient is instructed to stop all blood thinners prior to surgery. The patient is instructed to call the hospital the day before surgery to learn of the proper arrival time.   Contact our office with any questions or concerns. Follow up as indicated, or sooner should any new problems arise, if conditions worsen, or if they are otherwise concerned.   Ernest Pine, PA-C [facility] [address] [place], [place] Phone: [phone]  This note was generated in part with voice recognition software and I apologize for any typographical errors that were not detected and corrected.  Electronically signed by [phone], PA at 09/14/2021 5:15 PM EDT

## 2021-09-20 ENCOUNTER — Observation Stay: Payer: Medicare HMO

## 2021-09-20 ENCOUNTER — Encounter: Payer: Self-pay | Admitting: Orthopedic Surgery

## 2021-09-20 ENCOUNTER — Ambulatory Visit: Payer: Medicare HMO | Admitting: Anesthesiology

## 2021-09-20 ENCOUNTER — Encounter
Admission: RE | Disposition: A | Discharge status: Home Health | Payer: Self-pay | Source: Home / Self Care | Attending: Orthopedic Surgery

## 2021-09-20 ENCOUNTER — Other Ambulatory Visit: Payer: Self-pay

## 2021-09-20 ENCOUNTER — Observation Stay
Admission: RE | Admit: 2021-09-20 | Discharge: 2021-09-21 | Disposition: A | Discharge status: Home Health | Payer: Medicare HMO | Attending: Orthopedic Surgery | Admitting: Orthopedic Surgery

## 2021-09-20 ENCOUNTER — Ambulatory Visit: Payer: Medicare HMO | Admitting: Urgent Care

## 2021-09-20 DIAGNOSIS — N1831 Chronic kidney disease, stage 3a: Secondary | ICD-10-CM | POA: Insufficient documentation

## 2021-09-20 DIAGNOSIS — Z7982 Long term (current) use of aspirin: Secondary | ICD-10-CM | POA: Diagnosis not present

## 2021-09-20 DIAGNOSIS — M7989 Other specified soft tissue disorders: Secondary | ICD-10-CM | POA: Diagnosis not present

## 2021-09-20 DIAGNOSIS — M1612 Unilateral primary osteoarthritis, left hip: Principal | ICD-10-CM | POA: Insufficient documentation

## 2021-09-20 DIAGNOSIS — E039 Hypothyroidism, unspecified: Secondary | ICD-10-CM | POA: Insufficient documentation

## 2021-09-20 DIAGNOSIS — E1122 Type 2 diabetes mellitus with diabetic chronic kidney disease: Secondary | ICD-10-CM | POA: Diagnosis not present

## 2021-09-20 DIAGNOSIS — N183 Chronic kidney disease, stage 3 unspecified: Secondary | ICD-10-CM | POA: Diagnosis not present

## 2021-09-20 DIAGNOSIS — H3554 Dystrophies primarily involving the retinal pigment epithelium: Secondary | ICD-10-CM | POA: Insufficient documentation

## 2021-09-20 DIAGNOSIS — Z96642 Presence of left artificial hip joint: Secondary | ICD-10-CM | POA: Diagnosis not present

## 2021-09-20 DIAGNOSIS — Z79899 Other long term (current) drug therapy: Secondary | ICD-10-CM | POA: Insufficient documentation

## 2021-09-20 DIAGNOSIS — I10 Essential (primary) hypertension: Secondary | ICD-10-CM | POA: Insufficient documentation

## 2021-09-20 DIAGNOSIS — I129 Hypertensive chronic kidney disease with stage 1 through stage 4 chronic kidney disease, or unspecified chronic kidney disease: Secondary | ICD-10-CM | POA: Diagnosis not present

## 2021-09-20 DIAGNOSIS — Z471 Aftercare following joint replacement surgery: Secondary | ICD-10-CM | POA: Diagnosis not present

## 2021-09-20 HISTORY — PX: TOTAL HIP ARTHROPLASTY: SHX124

## 2021-09-20 LAB — ABO/RH: ABO/RH(D): A POS

## 2021-09-20 SURGERY — ARTHROPLASTY, HIP, TOTAL,POSTERIOR APPROACH
Anesthesia: Spinal | Site: Hip | Laterality: Left

## 2021-09-20 MED ORDER — PROPOFOL 10 MG/ML IV BOLUS
INTRAVENOUS | Status: AC
  Filled 2021-09-20: qty 20

## 2021-09-20 MED ORDER — PANTOPRAZOLE SODIUM 40 MG PO TBEC
40.0000 mg | DELAYED_RELEASE_TABLET | Freq: Two times a day (BID) | ORAL | Status: DC
  Administered 2021-09-20 – 2021-09-21 (×2): 40 mg via ORAL
  Filled 2021-09-20 (×2): qty 1

## 2021-09-20 MED ORDER — ATORVASTATIN CALCIUM 20 MG PO TABS: 40.0000 mg | ORAL_TABLET | Freq: Every evening | ORAL | Status: DC

## 2021-09-20 MED ORDER — FENTANYL CITRATE (PF) 100 MCG/2ML IJ SOLN
INTRAMUSCULAR | Status: AC
  Filled 2021-09-20: qty 2

## 2021-09-20 MED ORDER — PROPOFOL 500 MG/50ML IV EMUL
INTRAVENOUS | Status: DC | PRN
  Administered 2021-09-20: 200 ug/kg/min via INTRAVENOUS

## 2021-09-20 MED ORDER — CELECOXIB 200 MG PO CAPS
200.0000 mg | ORAL_CAPSULE | Freq: Two times a day (BID) | ORAL | Status: DC
  Administered 2021-09-21: 200 mg via ORAL
  Filled 2021-09-20: qty 1

## 2021-09-20 MED ORDER — TRIAMCINOLONE ACETONIDE 0.1 % EX CREA: 1.0000 | TOPICAL_CREAM | Freq: Two times a day (BID) | CUTANEOUS | Status: DC | PRN

## 2021-09-20 MED ORDER — FENTANYL CITRATE (PF) 100 MCG/2ML IJ SOLN
INTRAMUSCULAR | Status: DC | PRN
  Administered 2021-09-20 (×2): 50 ug via INTRAVENOUS

## 2021-09-20 MED ORDER — FAMOTIDINE 20 MG PO TABS
ORAL_TABLET | ORAL | Status: AC
  Administered 2021-09-20: 20 mg via ORAL
  Filled 2021-09-20: qty 1

## 2021-09-20 MED ORDER — PHENYLEPHRINE 80 MCG/ML (10ML) SYRINGE FOR IV PUSH (FOR BLOOD PRESSURE SUPPORT): PREFILLED_SYRINGE | INTRAVENOUS | Status: DC | PRN

## 2021-09-20 MED ORDER — ACETAMINOPHEN 10 MG/ML IV SOLN
INTRAVENOUS | Status: AC
  Filled 2021-09-20: qty 100

## 2021-09-20 MED ORDER — FENTANYL CITRATE (PF) 100 MCG/2ML IJ SOLN: 25.0000 ug | INTRAMUSCULAR | Status: DC | PRN

## 2021-09-20 MED ORDER — FERROUS SULFATE 325 (65 FE) MG PO TABS
325.0000 mg | ORAL_TABLET | Freq: Two times a day (BID) | ORAL | Status: DC
  Administered 2021-09-20 – 2021-09-21 (×2): 325 mg via ORAL
  Filled 2021-09-20 (×2): qty 1

## 2021-09-20 MED ORDER — HYDROCHLOROTHIAZIDE 25 MG PO TABS
25.0000 mg | ORAL_TABLET | Freq: Every day | ORAL | Status: DC
  Administered 2021-09-20 – 2021-09-21 (×2): 25 mg via ORAL
  Filled 2021-09-20 (×2): qty 1

## 2021-09-20 MED ORDER — OXYCODONE HCL 5 MG PO TABS: 5.0000 mg | ORAL_TABLET | ORAL | Status: DC | PRN

## 2021-09-20 MED ORDER — SODIUM CHLORIDE 0.9 % IR SOLN
Status: DC | PRN
  Administered 2021-09-20: 3000 mL

## 2021-09-20 MED ORDER — PHENYLEPHRINE HCL (PRESSORS) 10 MG/ML IV SOLN
INTRAVENOUS | Status: AC
  Filled 2021-09-20: qty 1

## 2021-09-20 MED ORDER — GABAPENTIN 300 MG PO CAPS
ORAL_CAPSULE | ORAL | Status: AC
  Administered 2021-09-20: 300 mg via ORAL
  Filled 2021-09-20: qty 1

## 2021-09-20 MED ORDER — METOCLOPRAMIDE HCL 5 MG PO TABS
10.0000 mg | ORAL_TABLET | Freq: Three times a day (TID) | ORAL | Status: DC
  Administered 2021-09-20 – 2021-09-21 (×3): 10 mg via ORAL
  Filled 2021-09-20 (×4): qty 2

## 2021-09-20 MED ORDER — OXYCODONE HCL 5 MG PO TABS: 10.0000 mg | ORAL_TABLET | ORAL | Status: DC | PRN

## 2021-09-20 MED ORDER — EPHEDRINE SULFATE (PRESSORS) 50 MG/ML IJ SOLN
INTRAMUSCULAR | Status: DC | PRN
  Administered 2021-09-20: 10 mg via INTRAVENOUS
  Administered 2021-09-20: 5 mg via INTRAVENOUS

## 2021-09-20 MED ORDER — ACETAMINOPHEN 10 MG/ML IV SOLN
INTRAVENOUS | Status: DC | PRN
  Administered 2021-09-20: 1000 mg via INTRAVENOUS

## 2021-09-20 MED ORDER — HYDROMORPHONE HCL 1 MG/ML IJ SOLN: 0.5000 mg | INTRAMUSCULAR | Status: DC | PRN

## 2021-09-20 MED ORDER — MIDAZOLAM HCL 2 MG/2ML IJ SOLN
INTRAMUSCULAR | Status: AC
  Filled 2021-09-20: qty 2

## 2021-09-20 MED ORDER — PHENOL 1.4 % MT LIQD
1.0000 | OROMUCOSAL | Status: DC | PRN
Start: 2021-09-20 — End: 2021-09-21

## 2021-09-20 MED ORDER — 0.9 % SODIUM CHLORIDE (POUR BTL) OPTIME
TOPICAL | Status: DC | PRN
  Administered 2021-09-20: 500 mL

## 2021-09-20 MED ORDER — TRAMADOL HCL 50 MG PO TABS: 50.0000 mg | ORAL_TABLET | ORAL | Status: DC | PRN

## 2021-09-20 MED ORDER — BISACODYL 10 MG RE SUPP: 10.0000 mg | Freq: Every day | RECTAL | Status: DC | PRN

## 2021-09-20 MED ORDER — OXYCODONE HCL 5 MG PO TABS: 5.0000 mg | ORAL_TABLET | Freq: Once | ORAL | Status: DC | PRN

## 2021-09-20 MED ORDER — LORATADINE 10 MG PO TABS
10.0000 mg | ORAL_TABLET | Freq: Every day | ORAL | Status: DC
  Administered 2021-09-20 – 2021-09-21 (×2): 10 mg via ORAL
  Filled 2021-09-20 (×2): qty 1

## 2021-09-20 MED ORDER — ACETAMINOPHEN 10 MG/ML IV SOLN
1000.0000 mg | Freq: Four times a day (QID) | INTRAVENOUS | Status: AC
  Administered 2021-09-20 – 2021-09-21 (×4): 1000 mg via INTRAVENOUS
  Filled 2021-09-20 (×5): qty 100

## 2021-09-20 MED ORDER — ONDANSETRON HCL 4 MG/2ML IJ SOLN: 4.0000 mg | Freq: Four times a day (QID) | INTRAMUSCULAR | Status: DC | PRN

## 2021-09-20 MED ORDER — PHENYLEPHRINE HCL-NACL 20-0.9 MG/250ML-% IV SOLN
INTRAVENOUS | Status: DC | PRN
  Administered 2021-09-20: 75 ug/min via INTRAVENOUS

## 2021-09-20 MED ORDER — FINASTERIDE 5 MG PO TABS
2.5000 mg | ORAL_TABLET | Freq: Every day | ORAL | Status: DC
  Administered 2021-09-20 – 2021-09-21 (×2): 2.5 mg via ORAL
  Filled 2021-09-20 (×2): qty 1
  Filled 2021-09-20 (×2): qty 0.5

## 2021-09-20 MED ORDER — TRANEXAMIC ACID-NACL 1000-0.7 MG/100ML-% IV SOLN
INTRAVENOUS | Status: AC
  Filled 2021-09-20: qty 100

## 2021-09-20 MED ORDER — BUPIVACAINE HCL (PF) 0.5 % IJ SOLN
INTRAMUSCULAR | Status: DC | PRN
  Administered 2021-09-20: 3 mL

## 2021-09-20 MED ORDER — ONDANSETRON HCL 4 MG/2ML IJ SOLN
INTRAMUSCULAR | Status: DC | PRN
  Administered 2021-09-20: 4 mg via INTRAVENOUS

## 2021-09-20 MED ORDER — CELECOXIB 200 MG PO CAPS
ORAL_CAPSULE | ORAL | Status: AC
  Administered 2021-09-20: 400 mg via ORAL
  Filled 2021-09-20: qty 2

## 2021-09-20 MED ORDER — SODIUM CHLORIDE 0.9 % IV SOLN
INTRAVENOUS | Status: DC
Start: 2021-09-20 — End: 2021-09-21

## 2021-09-20 MED ORDER — MENTHOL 3 MG MT LOZG
1.0000 | LOZENGE | OROMUCOSAL | Status: DC | PRN
Start: 2021-09-20 — End: 2021-09-21

## 2021-09-20 MED ORDER — DIPHENHYDRAMINE HCL 12.5 MG/5ML PO ELIX: 12.5000 mg | ORAL_SOLUTION | ORAL | Status: DC | PRN

## 2021-09-20 MED ORDER — MIDAZOLAM HCL 5 MG/5ML IJ SOLN
INTRAMUSCULAR | Status: DC | PRN
  Administered 2021-09-20: 2 mg via INTRAVENOUS

## 2021-09-20 MED ORDER — ENOXAPARIN SODIUM 30 MG/0.3ML IJ SOSY
30.0000 mg | PREFILLED_SYRINGE | Freq: Two times a day (BID) | INTRAMUSCULAR | Status: DC
  Administered 2021-09-21: 30 mg via SUBCUTANEOUS
  Filled 2021-09-20: qty 0.3

## 2021-09-20 MED ORDER — FLEET ENEMA 7-19 GM/118ML RE ENEM: 1.0000 | ENEMA | Freq: Once | RECTAL | Status: DC | PRN

## 2021-09-20 MED ORDER — ALUM & MAG HYDROXIDE-SIMETH 200-200-20 MG/5ML PO SUSP: 30.0000 mL | ORAL | Status: DC | PRN

## 2021-09-20 MED ORDER — ONDANSETRON HCL 4 MG PO TABS: 4.0000 mg | ORAL_TABLET | Freq: Four times a day (QID) | ORAL | Status: DC | PRN

## 2021-09-20 MED ORDER — PROPOFOL 1000 MG/100ML IV EMUL
INTRAVENOUS | Status: AC
Start: 2021-09-20 — End: ?
  Filled 2021-09-20: qty 100

## 2021-09-20 MED ORDER — ACETAMINOPHEN 325 MG PO TABS: 325.0000 mg | ORAL_TABLET | Freq: Four times a day (QID) | ORAL | Status: DC | PRN

## 2021-09-20 MED ORDER — SENNOSIDES-DOCUSATE SODIUM 8.6-50 MG PO TABS
1.0000 | ORAL_TABLET | Freq: Two times a day (BID) | ORAL | Status: DC
  Administered 2021-09-20 – 2021-09-21 (×2): 1 via ORAL
  Filled 2021-09-20 (×2): qty 1

## 2021-09-20 MED ORDER — DEXAMETHASONE SODIUM PHOSPHATE 10 MG/ML IJ SOLN
INTRAMUSCULAR | Status: AC
  Administered 2021-09-20: 8 mg via INTRAVENOUS
  Filled 2021-09-20: qty 1

## 2021-09-20 MED ORDER — SURGIRINSE WOUND IRRIGATION SYSTEM - OPTIME
TOPICAL | Status: DC | PRN
  Administered 2021-09-20: 450 mL via TOPICAL

## 2021-09-20 MED ORDER — TRANEXAMIC ACID-NACL 1000-0.7 MG/100ML-% IV SOLN
1000.0000 mg | Freq: Once | INTRAVENOUS | Status: AC
  Administered 2021-09-20: 1000 mg via INTRAVENOUS

## 2021-09-20 MED ORDER — OXYCODONE HCL 5 MG/5ML PO SOLN: 5.0000 mg | Freq: Once | ORAL | Status: DC | PRN

## 2021-09-20 MED ORDER — NEOMYCIN-POLYMYXIN B GU 40-200000 IR SOLN
Status: DC | PRN
  Administered 2021-09-20: 14 mL

## 2021-09-20 MED ORDER — MAGNESIUM HYDROXIDE 400 MG/5ML PO SUSP
30.0000 mL | Freq: Every day | ORAL | Status: DC
  Administered 2021-09-20 – 2021-09-21 (×2): 30 mL via ORAL
  Filled 2021-09-20 (×2): qty 30

## 2021-09-20 MED ORDER — CHLORHEXIDINE GLUCONATE 0.12 % MT SOLN
OROMUCOSAL | Status: AC
  Administered 2021-09-20: 15 mL via OROMUCOSAL
  Filled 2021-09-20: qty 15

## 2021-09-20 MED ORDER — LEVOTHYROXINE SODIUM 88 MCG PO TABS
88.0000 ug | ORAL_TABLET | Freq: Every day | ORAL | Status: DC
  Administered 2021-09-21: 88 ug via ORAL
  Filled 2021-09-20: qty 1

## 2021-09-20 MED ORDER — CEFAZOLIN SODIUM-DEXTROSE 2-4 GM/100ML-% IV SOLN
INTRAVENOUS | Status: AC
  Filled 2021-09-20: qty 100

## 2021-09-20 MED ORDER — CEFAZOLIN SODIUM-DEXTROSE 2-4 GM/100ML-% IV SOLN
2.0000 g | Freq: Four times a day (QID) | INTRAVENOUS | Status: AC
  Administered 2021-09-20 (×2): 2 g via INTRAVENOUS
  Filled 2021-09-20 (×2): qty 100

## 2021-09-20 SURGICAL SUPPLY — 68 items
BLADE DRUM FLTD (BLADE) ×2 IMPLANT
BLADE SAW 90X25X1.19 OSCILLAT (BLADE) ×2 IMPLANT
CARTRIDGE OIL MAESTRO DRILL (MISCELLANEOUS) ×1 IMPLANT
DIFFUSER DRILL AIR PNEUMATIC (MISCELLANEOUS) ×2 IMPLANT
DRAPE 3/4 80X56 (DRAPES) ×2 IMPLANT
DRAPE INCISE IOBAN 66X45 STRL (DRAPES) ×1 IMPLANT
DRAPE INCISE IOBAN 66X60 STRL (DRAPES) ×2 IMPLANT
DRSG DERMACEA 8X12 NADH (GAUZE/BANDAGES/DRESSINGS) ×1 IMPLANT
DRSG DERMACEA NONADH 3X8 (GAUZE/BANDAGES/DRESSINGS) ×2 IMPLANT
DRSG MEPILEX SACRM 8.7X9.8 (GAUZE/BANDAGES/DRESSINGS) ×2 IMPLANT
DRSG OPSITE POSTOP 4X12 (GAUZE/BANDAGES/DRESSINGS) ×2 IMPLANT
DRSG OPSITE POSTOP 4X14 (GAUZE/BANDAGES/DRESSINGS) ×1 IMPLANT
DRSG TEGADERM 4X4.75 (GAUZE/BANDAGES/DRESSINGS) ×2 IMPLANT
DURAPREP 26ML APPLICATOR (WOUND CARE) ×4 IMPLANT
ELECT CAUTERY BLADE 6.4 (BLADE) ×2 IMPLANT
ELECT REM PT RETURN 9FT ADLT (ELECTROSURGICAL) ×2
ELECTRODE REM PT RTRN 9FT ADLT (ELECTROSURGICAL) ×1 IMPLANT
GLOVE BIO SURGEON STRL SZ7.5 (GLOVE) ×4 IMPLANT
GLOVE BIOGEL M STRL SZ7.5 (GLOVE) ×4 IMPLANT
GLOVE BIOGEL PI ORTHO PRO 7.5 (GLOVE) ×2
GLOVE PI ORTHO PRO STRL 7.5 (GLOVE) ×2 IMPLANT
GLOVE SURG UNDER LTX SZ8 (GLOVE) ×2 IMPLANT
GLOVE SURG UNDER POLY LF SZ7.5 (GLOVE) ×2 IMPLANT
GOWN STRL REUS W/ TWL LRG LVL3 (GOWN DISPOSABLE) ×2 IMPLANT
GOWN STRL REUS W/ TWL XL LVL3 (GOWN DISPOSABLE) ×1 IMPLANT
GOWN STRL REUS W/TWL LRG LVL3 (GOWN DISPOSABLE) ×2
GOWN STRL REUS W/TWL XL LVL3 (GOWN DISPOSABLE) ×1
HEAD FEM STD 32X+1 STRL (Hips) ×1 IMPLANT
HEMOVAC 400CC 10FR (MISCELLANEOUS) ×2 IMPLANT
HOLDER FOLEY CATH W/STRAP (MISCELLANEOUS) ×2 IMPLANT
HOLSTER ELECTROSUGICAL PENCIL (MISCELLANEOUS) ×4 IMPLANT
HOOD PEEL AWAY FLYTE STAYCOOL (MISCELLANEOUS) ×4 IMPLANT
IV NS IRRIG 3000ML ARTHROMATIC (IV SOLUTION) ×2 IMPLANT
KIT PEG BOARD PINK (KITS) ×2 IMPLANT
KIT TURNOVER KIT A (KITS) ×2 IMPLANT
LINER MARATHON 10 DEG 32MMX450 (Hips) ×1 IMPLANT
LINER MARATHON 10D 32MMX450 (Hips) IMPLANT
MANIFOLD NEPTUNE II (INSTRUMENTS) ×4 IMPLANT
NDL SAFETY ECLIPSE 18X1.5 (NEEDLE) ×1 IMPLANT
NEEDLE HYPO 18GX1.5 SHARP (NEEDLE) ×1
NS IRRIG 1000ML POUR BTL (IV SOLUTION) ×2 IMPLANT
NS IRRIG 500ML POUR BTL (IV SOLUTION) ×2 IMPLANT
OIL CARTRIDGE MAESTRO DRILL (MISCELLANEOUS) ×2
PACK HIP PROSTHESIS (MISCELLANEOUS) ×2 IMPLANT
PENCIL SMOKE EVACUATOR COATED (MISCELLANEOUS) ×2 IMPLANT
PIN SECT CUP 50MM (Hips) ×1 IMPLANT
PIN STEIN THRED 5/32 (Pin) ×2 IMPLANT
PULSAVAC PLUS IRRIG FAN TIP (DISPOSABLE) ×2
SOL PREP PVP 2OZ (MISCELLANEOUS) ×2
SOLUTION IRRIG SURGIPHOR (IV SOLUTION) ×2 IMPLANT
SOLUTION PREP PVP 2OZ (MISCELLANEOUS) ×1 IMPLANT
SPONGE DRAIN TRACH 4X4 STRL 2S (GAUZE/BANDAGES/DRESSINGS) ×2 IMPLANT
STAPLER SKIN PROX 35W (STAPLE) ×2 IMPLANT
STEM AML 12X155X30 STD SM 6IN (Joint) ×1 IMPLANT
SUT ETHIBOND #5 BRAIDED 30INL (SUTURE) ×2 IMPLANT
SUT VIC AB 0 CT1 36 (SUTURE) ×2 IMPLANT
SUT VIC AB 1 CT1 36 (SUTURE) ×4 IMPLANT
SUT VIC AB 2-0 CT1 27 (SUTURE) ×1
SUT VIC AB 2-0 CT1 TAPERPNT 27 (SUTURE) ×1 IMPLANT
SYR 20ML LL LF (SYRINGE) ×2 IMPLANT
TAPE CLOTH 3X10 WHT NS LF (GAUZE/BANDAGES/DRESSINGS) ×2 IMPLANT
TAPE TRANSPORE STRL 2 31045 (GAUZE/BANDAGES/DRESSINGS) ×2 IMPLANT
TIP FAN IRRIG PULSAVAC PLUS (DISPOSABLE) ×1 IMPLANT
TOWEL OR 17X26 4PK STRL BLUE (TOWEL DISPOSABLE) IMPLANT
TRAP FLUID SMOKE EVACUATOR (MISCELLANEOUS) ×3 IMPLANT
TRAY FOLEY MTR SLVR 16FR STAT (SET/KITS/TRAYS/PACK) ×2 IMPLANT
TUBE KAMVAC SUCTION (TUBING) ×2 IMPLANT
WATER STERILE IRR 1000ML POUR (IV SOLUTION) ×3 IMPLANT

## 2021-09-20 NOTE — Anesthesia Procedure Notes (Signed)
Spinal  Patient location during procedure: OR Start time: 09/20/2021 7:45 AM End time: 09/20/2021 7:22 AM Reason for block: surgical anesthesia Staffing Performed: resident/CRNA  Resident/CRNA: Nelda Marseille, CRNA Performed by: Nelda Marseille, CRNA Authorized by: Andria Frames, MD   Preanesthetic Checklist Completed: patient identified, IV checked, site marked, risks and benefits discussed, surgical consent, monitors and equipment checked, pre-op evaluation and timeout performed Spinal Block Patient position: sitting Prep: ChloraPrep Patient monitoring: heart rate, continuous pulse ox, blood pressure and cardiac monitor Approach: midline Location: L3-4 Injection technique: single-shot Needle Needle type: Whitacre and Introducer  Needle gauge: 25 G Needle length: 9 cm Assessment Sensory level: T10 Events: CSF return Additional Notes Sterile aseptic technique used throughout the procedure.  Negative paresthesia. Negative blood return. Positive free-flowing CSF. Expiration date of kit checked and confirmed. Patient tolerated procedure well, without complications.

## 2021-09-20 NOTE — Interval H&P Note (Signed)
History and Physical Interval Note:  09/20/2021 6:17 AM  Amy Meyer  has presented today for surgery, with the diagnosis of PRIMARY OSTEOARTHRITIS OF LEFT HIP.Marland Kitchen  The various methods of treatment have been discussed with the patient and family. After consideration of risks, benefits and other options for treatment, the patient has consented to  Procedure(s): TOTAL HIP ARTHROPLASTY (Left) as a surgical intervention.  The patient's history has been reviewed, patient examined, no change in status, stable for surgery.  I have reviewed the patient's chart and labs.  Questions were answered to the patient's satisfaction.     [name] P Gilles Trimpe

## 2021-09-20 NOTE — Evaluation (Signed)
Physical Therapy Evaluation Patient Details Name: Amy Meyer MRN: [id number] DOB: July 24, 1955 Today's Date: 09/20/2021  History of Present Illness  Pt is a 66 y.o. female s/p L THA secondary to degenerative arthrosis 09/20/21.  PMH includes htn, DM, adult onset vitelliform macular dystrophy, CKD, legally blind, lymphedema.  Clinical Impression  Prior to surgery, pt was modified independent ambulating with SPC; lives with her husband on main level of home with steps to enter.  5/10 L hip pain at rest beginning of session and 3/10 at rest end of session.  Currently pt is SBA semi-supine to sitting edge of bed; CGA with transfers using RW; and CGA ambulating 20 feet with RW use.  Pt noted with flexed posture during ambulation (pt reports chronic) and may benefit from trial of youth sized RW to improve walker fit.  Pt would benefit from skilled PT to address noted impairments and functional limitations (see below for any additional details).  Upon hospital discharge, pt would benefit from HHPT and family support at home.    Recommendations for follow up therapy are one component of a multi-disciplinary discharge planning process, led by the attending physician.  Recommendations may be updated based on patient status, additional functional criteria and insurance authorization.  Follow Up Recommendations Home health PT      Assistance Recommended at Discharge Set up Supervision/Assistance  Patient can return home with the following  A little help with walking and/or transfers;A little help with bathing/dressing/bathroom;Assistance with cooking/housework;Assist for transportation;Help with stairs or ramp for entrance    Equipment Recommendations Rolling walker (2 wheels);BSC/3in1 (youth sized)  Recommendations for Other Services  OT consult    Functional Status Assessment Patient has had a recent decline in their functional status and demonstrates the ability to make significant improvements  in function in a reasonable and predictable amount of time.     Precautions / Restrictions Precautions Precautions: Fall;Posterior Hip Precaution Booklet Issued: Yes (comment) Precaution Comments: Legally blind Restrictions Weight Bearing Restrictions: Yes LLE Weight Bearing: Weight bearing as tolerated      Mobility  Bed Mobility Overal bed mobility: Needs Assistance Bed Mobility: Supine to Sit     Supine to sit: Supervision, HOB elevated     General bed mobility comments: mild increased effort/time to perform on own; use of bed rail; vc's for posterior THP's    Transfers Overall transfer level: Needs assistance Equipment used: Rolling walker (2 wheels) Transfers: Sit to/from Stand Sit to Stand: Min guard           General transfer comment: vc's for UE/LE placement and overall technique; increased effort to stand from bed    Ambulation/Gait Ambulation/Gait assistance: Min guard Gait Distance (Feet): 20 Feet Assistive device: Rolling walker (2 wheels)   Gait velocity: decreased     General Gait Details: antalgic; decreased stance time L LE; flexed posture (pt reports chronic d/t L hip pain); decreased B LE step length  Stairs            Wheelchair Mobility    Modified Rankin (Stroke Patients Only)       Balance Overall balance assessment: Needs assistance Sitting-balance support: No upper extremity supported, Feet supported Sitting balance-Leahy Scale: Good Sitting balance - Comments: steady sitting reaching within BOS   Standing balance support: Bilateral upper extremity supported, During functional activity, Reliant on assistive device for balance Standing balance-Leahy Scale: Fair Standing balance comment: steady ambulating with RW use  Pertinent Vitals/Pain Pain Assessment Pain Assessment: 0-10 Pain Score: 3  Pain Location: L hip Pain Descriptors / Indicators: Tender, Sore Pain Intervention(s):  Limited activity within patient's tolerance, Monitored during session, Premedicated before session, Repositioned, Ice applied Vitals (HR and O2 on room air) stable and WFL throughout treatment session.    Home Living Family/patient expects to be discharged to:: Private residence Living Arrangements: Spouse/significant other Available Help at Discharge: Family;Available 24 hours/day Type of Home: House Home Access: Stairs to enter Entrance Stairs-Rails: Left Entrance Stairs-Number of Steps: 5-6 (from front door)   Home Layout: Two level;Able to live on main level with bedroom/bathroom Home Equipment: Cane - single point (RW from her dad)      Prior Function Prior Level of Function : Independent/Modified Independent             Mobility Comments: Modified independent ambulating with SPC.       Hand Dominance        Extremity/Trunk Assessment   Upper Extremity Assessment Upper Extremity Assessment: Overall WFL for tasks assessed    Lower Extremity Assessment Lower Extremity Assessment: LLE deficits/detail (R LE WFL) LLE Deficits / Details: at least 2+5 L hip flexion (limited d/t L hip pain); at least 3/5 AROM knee flexion/extension and DF/PF LLE: Unable to fully assess due to pain    Cervical / Trunk Assessment Cervical / Trunk Assessment: Normal  Communication   Communication: No difficulties  Cognition Arousal/Alertness: Awake/alert Behavior During Therapy: WFL for tasks assessed/performed Overall Cognitive Status: Within Functional Limits for tasks assessed                                          General Comments General comments (skin integrity, edema, etc.): L hip hemovac in place.  Pt reports she is legally blind (does not have central vision--just peripheral; wears special glasses).    Exercises Total Joint Exercises Ankle Circles/Pumps: AROM, Strengthening, Both, 10 reps, Supine Quad Sets: AROM, Strengthening, Both, 10 reps,  Supine Heel Slides: AAROM, Strengthening, Left, 10 reps, Supine Hip ABduction/ADduction: AAROM, Strengthening, Left, 10 reps, Supine   Assessment/Plan    PT Assessment Patient needs continued PT services  PT Problem List Decreased strength;Decreased activity tolerance;Decreased balance;Decreased mobility;Decreased knowledge of use of DME;Decreased knowledge of precautions;Pain;Decreased skin integrity       PT Treatment Interventions DME instruction;Gait training;Stair training;Functional mobility training;Therapeutic activities;Therapeutic exercise;Balance training;Patient/family education    PT Goals (Current goals can be found in the Care Plan section)  Acute Rehab PT Goals Patient Stated Goal: to improve strength and mobility PT Goal Formulation: With patient Time For Goal Achievement: 10/04/21 Potential to Achieve Goals: Good    Frequency BID     Co-evaluation               AM-PAC PT "6 Clicks" Mobility  Outcome Measure Help needed turning from your back to your side while in a flat bed without using bedrails?: A Little Help needed moving from lying on your back to sitting on the side of a flat bed without using bedrails?: A Little Help needed moving to and from a bed to a chair (including a wheelchair)?: A Little Help needed standing up from a chair using your arms (e.g., wheelchair or bedside chair)?: A Little Help needed to walk in hospital room?: A Little Help needed climbing 3-5 steps with a railing? : A Little 6 Click Score: 18  End of Session Equipment Utilized During Treatment: Gait belt Activity Tolerance: Patient tolerated treatment well Patient left: in chair;with call bell/phone within reach;with chair alarm set;with nursing/sitter in room;with family/visitor present;with SCD's reapplied;Other (comment) (B heels floating via pillow support; pillows between pt's knees for posterior THP's) Nurse Communication: Mobility status;Precautions;Weight bearing  status PT Visit Diagnosis: Other abnormalities of gait and mobility (R26.89);Muscle weakness (generalized) (M62.81);Pain Pain - Right/Left: Left Pain - part of body: Hip    Time: [id number] PT Time Calculation (min) (ACUTE ONLY): 36 min   Charges:   PT Evaluation $PT Eval Low Complexity: 1 Low PT Treatments $Therapeutic Exercise: 8-22 mins $Therapeutic Activity: 8-22 mins       Leitha Bleak, PT 09/20/21, 5:08 PM

## 2021-09-20 NOTE — Anesthesia Preprocedure Evaluation (Signed)
Anesthesia Evaluation  Patient identified by MRN, date of birth, ID band Patient awake    Reviewed: Allergy & Precautions, NPO status , Patient's Chart, lab work & pertinent test results  History of Anesthesia Complications Negative for: history of anesthetic complications  Airway Mallampati: III  TM Distance: <3 FB Neck ROM: full    Dental  (+) Chipped   Pulmonary neg shortness of breath,    Pulmonary exam normal        Cardiovascular Exercise Tolerance: Good hypertension, (-) angina(-) Past MI and (-) DOE Normal cardiovascular exam     Neuro/Psych negative neurological ROS  negative psych ROS   GI/Hepatic negative GI ROS, Neg liver ROS,   Endo/Other  diabetes, Type 2Hypothyroidism   Renal/GU Renal disease     Musculoskeletal   Abdominal   Peds  Hematology negative hematology ROS (+)   Anesthesia Other Findings Past Medical History: No date: Adult onset vitelliform macular dystrophy No date: Arthritis No date: Chronic kidney disease     Comment:  stage 3 chronic kidney disease No date: Hyperlipidemia No date: Hypertension No date: Hypothyroidism No date: Legally blind No date: Lymphedema No date: Pre-diabetes  Past Surgical History: No date: BREAST BIOPSY; Right     Comment:  bx/clip-neg 02/13/2017: COLONOSCOPY WITH PROPOFOL; N/A     Comment:  Procedure: COLONOSCOPY WITH PROPOFOL;  Surgeon: Scot Jun, MD;  Location: [facility] ENDOSCOPY;  Service:               Endoscopy;  Laterality: N/A;     Reproductive/Obstetrics negative OB ROS                             Anesthesia Physical Anesthesia Plan  ASA: 3  Anesthesia Plan: Spinal   Post-op Pain Management:    Induction:   PONV Risk Score and Plan:   Airway Management Planned: Natural Airway and Nasal Cannula  Additional Equipment:   Intra-op Plan:   Post-operative Plan:   Informed Consent: I  have reviewed the patients History and Physical, chart, labs and discussed the procedure including the risks, benefits and alternatives for the proposed anesthesia with the patient or authorized representative who has indicated his/her understanding and acceptance.     Dental Advisory Given  Plan Discussed with: Anesthesiologist, CRNA and Surgeon  Anesthesia Plan Comments: (Patient reports no bleeding problems and no anticoagulant use.  Plan for spinal with backup GA  Patient consented for risks of anesthesia including but not limited to:  - adverse reactions to medications - damage to eyes, teeth, lips or other oral mucosa - nerve damage due to positioning  - risk of bleeding, infection and or nerve damage from spinal that could lead to paralysis - risk of headache or failed spinal - damage to teeth, lips or other oral mucosa - sore throat or hoarseness - damage to heart, brain, nerves, lungs, other parts of body or loss of life  Patient voiced understanding.)        Anesthesia Quick Evaluation

## 2021-09-20 NOTE — Op Note (Signed)
OPERATIVE NOTE  DATE OF SURGERY:  09/20/2021  PATIENT NAME:  Amy Meyer   DOB: [birth year]  MRN: [id number]  PRE-OPERATIVE DIAGNOSIS: Degenerative arthrosis of the left hip, primary  POST-OPERATIVE DIAGNOSIS:  Same  PROCEDURE:  Left total hip arthroplasty  SURGEON:  Jena Gauss. M.D.  ASSISTANT: Baldwin Jamaica, PA-C (present and scrubbed throughout the case, critical for assistance with exposure, retraction, instrumentation, and closure)  ANESTHESIA: spinal  ESTIMATED BLOOD LOSS: 150 mL  FLUIDS REPLACED: 1300 mL of crystalloid  DRAINS: 2 medium Hemovac drains  IMPLANTS UTILIZED: DePuy 12 mm small stature AML femoral stem, 50 mm OD Pinnacle 100 acetabular component, +4 mm 10 degree Pinnacle Marathon polyethylene insert, and a 32 mm CoCr +1 mm hip ball  INDICATIONS FOR SURGERY: Amy Meyer is a 66 y.o. year old female with a long history of progressive hip and groin  pain. X-rays demonstrated severe degenerative changes. The patient had not seen any significant improvement despite conservative nonsurgical intervention. After discussion of the risks and benefits of surgical intervention, the patient expressed understanding of the risks benefits and agree with plans for total hip arthroplasty.   The risks, benefits, and alternatives were discussed at length including but not limited to the risks of infection, bleeding, nerve injury, stiffness, blood clots, the need for revision surgery, limb length inequality, dislocation, cardiopulmonary complications, among others, and they were willing to proceed.  PROCEDURE IN DETAIL: The patient was brought into the operating room and, after adequate spinal anesthesia was achieved, the patient was placed in a right lateral decubitus position. Axillary roll was placed and all bony prominences were well-padded. The patient's left hip was cleaned and prepped with alcohol and DuraPrep and draped in the usual sterile fashion. A  "timeout" was performed as per usual protocol. A lateral curvilinear incision was made gently curving towards the posterior superior iliac spine. The IT band was incised in line with the skin incision and the fibers of the gluteus maximus were split in line. The piriformis tendon was identified, skeletonized, and incised at its insertion to the proximal femur and reflected posteriorly. A T type posterior capsulotomy was performed. Prior to dislocation of the femoral head, a threaded Steinmann pin was inserted through a separate stab incision into the pelvis superior to the acetabulum and bent in the form of a stylus so as to assess limb length and hip offset throughout the procedure. The femoral head was then dislocated posteriorly. Inspection of the femoral head demonstrated severe degenerative changes with full-thickness loss of articular cartilage. The femoral neck cut was performed using an oscillating saw. The anterior capsule was elevated off of the femoral neck using a periosteal elevator. Attention was then directed to the acetabulum. The remnant of the labrum was excised using electrocautery. Inspection of the acetabulum also demonstrated significant degenerative changes. The acetabulum was reamed in sequential fashion up to a 49 mm diameter. Good punctate bleeding bone was encountered. A 50 mm Pinnacle 100 acetabular component was positioned and impacted into place. Good scratch fit was appreciated. A neutral polyethylene trial was inserted.  Attention was then directed to the proximal femur. A hole for reaming of the proximal femoral canal was created using a high-speed burr. The femoral canal was reamed in sequential fashion up to a 11.5 mm diameter. This allowed for approximately 8 cm of scratch fit.  It was thus elected to ream up to a 12 mm diameter to allow for a line to line fit.  Serial broaches  were inserted up to a 12 mm small stature femoral broach. Calcar region was planed and a trial  reduction was performed using a 32 mm hip ball with a +1 mm neck length.  Reasonably good stability was appreciated it was elected to trial with a +4 mm 10 degree trial with the high side positioned at the 5 o'clock position.  Good equalization of limb lengths and hip offset was appreciated and excellent stability was noted both anteriorly and posteriorly. Trial components were removed. The acetabular shell was irrigated with copious amounts of normal saline with antibiotic solution and suctioned dry. A +4 mm 10 degree Pinnacle Marathon polyethylene insert was positioned with the high side at the 5 o'clock position and impacted into place. Next, a 12 mm small stature AML femoral stem was positioned and impacted into place. Excellent scratch fit was appreciated. A trial reduction was again performed with a 32 mm hip ball with a +1 mm neck length. Again, good equalization of limb lengths was appreciated and excellent stability appreciated both anteriorly and posteriorly. The hip was then dislocated and the trial hip ball was removed. The Morse taper was cleaned and dried. A 32 mm cobalt chrome hip ball with a +1 mm neck length was placed on the trunnion and impacted into place. The hip was then reduced and placed through range of motion. Excellent stability was appreciated both anteriorly and posteriorly.  The wound was irrigated with copious amounts of normal saline followed by 450 ml of Surgiphor and suctioned dry. Good hemostasis was appreciated. The posterior capsulotomy was repaired using #5 Ethibond. Piriformis tendon was reapproximated to the undersurface of the gluteus medius tendon using #5 Ethibond. The IT band was reapproximated using interrupted sutures of #1 Vicryl. Subcutaneous tissue was approximated using first #0 Vicryl followed by #2-0 Vicryl. The skin was closed with skin staples.  The patient tolerated the procedure well and was transported to the recovery room in stable condition.   Jena Gauss., M.D.

## 2021-09-20 NOTE — Transfer of Care (Signed)
Immediate Anesthesia Transfer of Care Note  Patient: Amy Meyer  Procedure(s) Performed: TOTAL HIP ARTHROPLASTY (Left: Hip)  Patient Location: PACU  Anesthesia Type:Spinal  Level of Consciousness: sedated  Airway & Oxygen Therapy: Patient Spontanous Breathing and Patient connected to face mask oxygen  Post-op Assessment: Report given to RN and Post -op Vital signs reviewed and stable  Post vital signs: Reviewed and stable  Last Vitals:  Vitals Value Taken Time  BP 115/64 09/20/21 1222  Temp    Pulse 95 09/20/21 1224  Resp 17 09/20/21 1224  SpO2 99 % 09/20/21 1224  Vitals shown include unvalidated device data.  Last Pain:  Vitals:   09/20/21 0635  PainSc: 2          Complications: No notable events documented.

## 2021-09-20 NOTE — Anesthesia Procedure Notes (Signed)
Date/Time: 09/20/2021 7:25 AM  Performed by: Junious Silk, CRNAPre-anesthesia Checklist: Patient identified, Emergency Drugs available, Suction available, Patient being monitored and Timeout performed Oxygen Delivery Method: Simple face mask

## 2021-09-21 DIAGNOSIS — N1831 Chronic kidney disease, stage 3a: Secondary | ICD-10-CM | POA: Diagnosis not present

## 2021-09-21 DIAGNOSIS — E039 Hypothyroidism, unspecified: Secondary | ICD-10-CM | POA: Diagnosis not present

## 2021-09-21 DIAGNOSIS — I129 Hypertensive chronic kidney disease with stage 1 through stage 4 chronic kidney disease, or unspecified chronic kidney disease: Secondary | ICD-10-CM | POA: Diagnosis not present

## 2021-09-21 DIAGNOSIS — Z7982 Long term (current) use of aspirin: Secondary | ICD-10-CM | POA: Diagnosis not present

## 2021-09-21 DIAGNOSIS — M1612 Unilateral primary osteoarthritis, left hip: Secondary | ICD-10-CM | POA: Diagnosis not present

## 2021-09-21 DIAGNOSIS — Z79899 Other long term (current) drug therapy: Secondary | ICD-10-CM | POA: Diagnosis not present

## 2021-09-21 DIAGNOSIS — E1122 Type 2 diabetes mellitus with diabetic chronic kidney disease: Secondary | ICD-10-CM | POA: Diagnosis not present

## 2021-09-21 LAB — TYPE AND SCREEN
ABO/RH(D): A POS
Antibody Screen: POSITIVE
Unit division: 0
Unit division: 0

## 2021-09-21 LAB — BPAM RBC
Blood Product Expiration Date: 202308312359
Blood Product Expiration Date: 202308312359
Unit Type and Rh: 6200
Unit Type and Rh: 6200

## 2021-09-21 MED ORDER — CELECOXIB 200 MG PO CAPS: 200.0000 mg | ORAL_CAPSULE | Freq: Two times a day (BID) | ORAL | 0 refills | Status: DC

## 2021-09-21 MED ORDER — TRAMADOL HCL 50 MG PO TABS
50.0000 mg | ORAL_TABLET | ORAL | 0 refills | Status: DC | PRN
Start: 2021-09-21 — End: 2022-01-04

## 2021-09-21 MED ORDER — OXYCODONE HCL 5 MG PO TABS: 5.0000 mg | ORAL_TABLET | ORAL | 0 refills | Status: AC | PRN

## 2021-09-21 MED ORDER — ENOXAPARIN SODIUM 40 MG/0.4ML IJ SOSY: 40.0000 mg | PREFILLED_SYRINGE | INTRAMUSCULAR | 0 refills | Status: DC

## 2021-09-21 NOTE — Evaluation (Signed)
Occupational Therapy Evaluation Patient Details Name: Amy Meyer MRN: [id number] DOB: 10/17/1955 Today's Date: 09/21/2021   History of Present Illness Pt is a 66 y.o. female s/p L THA secondary to degenerative arthrosis 09/20/21.  PMH includes htn, DM, adult onset vitelliform macular dystrophy, CKD, legally blind, lymphedema.   Clinical Impression   Patient seen for OT evaluation this date. Patient presenting with L hip pain, decreased strength, and decreased endurance impacting safety and independence with ADLs. At baseline, pt was independent with ADLs/IADLs and Mod I for functional mobility using a SPC. Pt lives with spouse who is available close to 24/7 to provide physical assistance as needed upon D/C. Patient currently functioning at supervision for functional transfers/mobility and Min A at most for ADLs. Pt and spouse educated on posterior hip precautions, WB precautions, and use of AE for LB dressing with teach back provided. Patient will benefit from acute skilled OT services while in this hospital to increase overall independence in the areas of ADLs and functional mobility in order to safely discharge home.      Recommendations for follow up therapy are one component of a multi-disciplinary discharge planning process, led by the attending physician.  Recommendations may be updated based on patient status, additional functional criteria and insurance authorization.   Follow Up Recommendations  Home health OT    Assistance Recommended at Discharge PRN  Patient can return home with the following A little help with bathing/dressing/bathroom;Assistance with cooking/housework;Assist for transportation    Functional Status Assessment  Patient has had a recent decline in their functional status and demonstrates the ability to make significant improvements in function in a reasonable and predictable amount of time.  Equipment Recommendations  Other (comment) (reacher)            Precautions / Restrictions Precautions Precautions: Fall;Posterior Hip Precaution Booklet Issued: Yes (comment) Precaution Comments: Legally blind Restrictions Weight Bearing Restrictions: Yes LLE Weight Bearing: Weight bearing as tolerated      Mobility Bed Mobility Overal bed mobility:  (pt received in recliner)               Patient Response: Cooperative  Transfers Overall transfer level: Needs assistance Equipment used: Rolling walker (2 wheels) Transfers: Sit to/from Stand Sit to Stand: Supervision (STS from recliner and then from [facility] frame over toilet)           General transfer comment: VC to kick out LLE before sitting/standing      Balance Overall balance assessment: Needs assistance Sitting-balance support: No upper extremity supported, Feet supported Sitting balance-Leahy Scale: Good Sitting balance - Comments: good dynamic sitting balance during ADL tasks   Standing balance support: Bilateral upper extremity supported, During functional activity, Reliant on assistive device for balance Standing balance-Leahy Scale: Good Standing balance comment: steady ambulating with RW use                           ADL either performed or assessed with clinical judgement   ADL Overall ADL's : Needs assistance/impaired     Grooming: Wash/dry hands;Standing;Supervision/safety Grooming Details (indicate cue type and reason): Pt completed hand hygiene while standing at bathroom sink             Lower Body Dressing: Minimal assistance;With adaptive equipment;Sit to/from stand;Cueing for safety;Cueing for sequencing Lower Body Dressing Details (indicate cue type and reason): Pt completed LB dressing with use of reacher for donning/doffing mesh underwear Toilet Transfer: Supervision/safety;Rolling walker (2 wheels);Cueing for  safety;Regular Toilet;BSC/3in1 Statistician Details (indicate cue type and reason): BSC frame over regular toilet, use  of handrails. VC to kick LLE out before sitting/standing Toileting- Architect and Hygiene: Min guard;Sit to/from stand;Cueing for safety Toileting - Clothing Manipulation Details (indicate cue type and reason): completed peri care in standing     Functional mobility during ADLs: Supervision/safety;Rolling walker (2 wheels)       Vision Baseline Vision/History: 2 Legally blind Patient Visual Report: No change from baseline                  Pertinent Vitals/Pain Pain Assessment Pain Assessment: 0-10 Pain Score: 1  Pain Location: L hip Pain Descriptors / Indicators: Tender, Sore Pain Intervention(s): Monitored during session, Repositioned, Premedicated before session          Extremity/Trunk Assessment Upper Extremity Assessment Upper Extremity Assessment: Overall WFL for tasks assessed   Lower Extremity Assessment Lower Extremity Assessment: LLE deficits/detail (RLE WFL) LLE: Unable to fully assess due to pain   Cervical / Trunk Assessment Cervical / Trunk Assessment: Normal   Communication Communication Communication: No difficulties   Cognition Arousal/Alertness: Awake/alert Behavior During Therapy: WFL for tasks assessed/performed Overall Cognitive Status: Within Functional Limits for tasks assessed                                                        Home Living Family/patient expects to be discharged to:: Private residence Living Arrangements: Spouse/significant other Available Help at Discharge: Family;Available 24 hours/day Type of Home: House Home Access: Stairs to enter [employer] of Steps: 5-6 (from front door) Entrance Stairs-Rails: Left Home Layout: Two level;Able to live on main level with bedroom/bathroom     Bathroom Shower/Tub: Producer, television/film/video: Standard     Home Equipment: Cane - single point;Shower seat - built in (RW from her dad)          Prior  Functioning/Environment Prior Level of Function : Independent/Modified Independent             Mobility Comments: Mod I for functional mobility with SPC. ADLs Comments: Pt reports being IND with ADLs, still drives, cooks, cleans, pt and husband are both retired. No hx of falls.        OT Problem List: Decreased strength;Pain;Impaired balance (sitting and/or standing);Decreased activity tolerance;Decreased knowledge of use of DME or AE      OT Treatment/Interventions: Self-care/ADL training;Therapeutic exercise;Patient/family education;Balance training;Energy conservation;Therapeutic activities;DME and/or AE instruction    OT Goals(Current goals can be found in the care plan section) Acute Rehab OT Goals Patient Stated Goal: return home OT Goal Formulation: With patient/family Time For Goal Achievement: 10/05/21 Potential to Achieve Goals: Good ADL Goals Pt Will Perform Grooming: Independently;standing Pt Will Perform Upper Body Dressing: Independently Pt Will Perform Lower Body Dressing: with adaptive equipment;sitting/lateral leans;sit to/from stand;Independently Pt Will Transfer to Toilet: Independently;regular height toilet;grab bars Pt Will Perform Toileting - Clothing Manipulation and hygiene: sit to/from stand;Independently  OT Frequency: Min 2X/week                  AM-PAC OT "6 Clicks" Daily Activity     Outcome Measure Help from another person eating meals?: None Help from another person taking care of personal grooming?: A Little Help from another person toileting, which includes using toliet, bedpan,  or urinal?: A Little Help from another person bathing (including washing, rinsing, drying)?: A Little Help from another person to put on and taking off regular upper body clothing?: None Help from another person to put on and taking off regular lower body clothing?: A Little 6 Click Score: 20   End of Session Equipment Utilized During Treatment: Gait  belt;Rolling walker (2 wheels) Nurse Communication: Mobility status  Activity Tolerance: Patient tolerated treatment well Patient left: in chair;with call bell/phone within reach;with chair alarm set;with family/visitor present  OT Visit Diagnosis: Pain;Muscle weakness (generalized) (M62.81);Other abnormalities of gait and mobility (R26.89);Low vision, both eyes (H54.2) Pain - Right/Left: Left Pain - part of body: Hip                Time: 1031-1057 OT Time Calculation (min): 26 min Charges:  OT General Charges $OT Visit: 1 Visit OT Evaluation $OT Eval Low Complexity: 1 Low  [facility] MS, OTR/L ascom [phone]  09/21/21, 1:30 PM

## 2021-09-21 NOTE — Plan of Care (Signed)

## 2021-09-21 NOTE — Progress Notes (Signed)
  Chaplain On-Call responded to [place] Order from Skip Estimable, MD, for Advance Directives information for the patient.  Chaplain met the patient and her husband Renelda Loma. Chaplain provided the AD documents and education for the patient. Patient stated her understanding.  Chaplain informed patient of the process for completion of the AD forms if she chooses while hospitalized.  Chaplain Pollyann Samples M.Div., [facility]

## 2021-09-21 NOTE — Progress Notes (Signed)
  Subjective: 1 Day Post-Op Procedure(s) (LRB): TOTAL HIP ARTHROPLASTY (Left) Patient reports pain as well-controlled.   Patient is well, and has had no acute complaints or problems Plan is to go Home after hospital stay. Negative for chest pain and shortness of breath Fever: no Gastrointestinal: negative for nausea and vomiting.  Patient has not had a bowel movement.  Objective: Vital signs in last 24 hours: Temp:  [97.6 F (36.4 C)-98.2 F (36.8 C)] 98.1 F (36.7 C) (08/08 0727) Pulse Rate:  [76-97] 76 (08/08 0727) Resp:  [11-26] 16 (08/08 0727) BP: (112-136)/(53-65) 130/62 (08/08 0727) SpO2:  [97 %-100 %] 99 % (08/08 0727) Weight:  [94.6 kg] 94.6 kg (08/07 1346)  Intake/Output from previous day:  Intake/Output Summary (Last 24 hours) at 09/21/2021 1025 Last data filed at 09/21/2021 1000 Gross per 24 hour  Intake 2177.51 ml  Output 3180 ml  Net -1002.49 ml    Intake/Output this shift: Total I/O In: 240 [P.O.:240] Out: 500 [Urine:500]  Labs: No results for input(s): "HGB" in the last 72 hours. No results for input(s): "WBC", "RBC", "HCT", "PLT" in the last 72 hours. No results for input(s): "NA", "K", "CL", "CO2", "BUN", "CREATININE", "GLUCOSE", "CALCIUM" in the last 72 hours. No results for input(s): "LABPT", "INR" in the last 72 hours.   EXAM General - Patient is Alert, Appropriate, and Oriented Extremity - Neurovascular intact Dorsiflexion/Plantar flexion intact Compartment soft Dressing/Incision -clean, dry, Hemovac in place.  Motor Function - intact, moving foot and toes well on exam.  Cardiovascular- Regular rate and rhythm, no murmurs/rubs/gallops Respiratory- Lungs clear to auscultation bilaterally Gastrointestinal- soft, nontender, and hypoactive bowel sounds   Assessment/Plan: 1 Day Post-Op Procedure(s) (LRB): TOTAL HIP ARTHROPLASTY (Left) Principal Problem:   Hx of total hip arthroplasty, left  Estimated body mass index is 36.94 kg/m as calculated  from the following:   Height as of this encounter: 5\' 3"  (1.6 m).   Weight as of this encounter: 94.6 kg. Advance diet Up with therapy  Likely d/c this PM pending completion of therapy goals.   Hemovac removed. and Mini compression dressing applied.   DVT Prophylaxis - Lovenox, Ted hose, and SCDs Weight-Bearing as tolerated to left leg  , PA-C [facility] 09/21/2021, 10:25 AM

## 2021-09-21 NOTE — Progress Notes (Signed)
Physical Therapy Treatment Patient Details Name: Amy Meyer MRN: [id number] DOB: 06-26-1955 Today's Date: 09/21/2021   History of Present Illness Pt is a 66 y.o. female s/p L THA secondary to degenerative arthrosis 09/20/21.  PMH includes htn, DM, adult onset vitelliform macular dystrophy, CKD, legally blind, lymphedema.    PT Comments    Pt resting in recliner upon PT arrival; pt dressed and ready to go home (pt waiting for DME--TOC notified who reports DME should be here soon).  2/10 L hip pain during session.  Able to transfer and ambulate 160 feet with RW SBA; pt steady and safe with functional mobility during sessions activities.  Reviewed posterior THP's: pt able to state 3/3 precautions and followed precautions appropriately during sessions activities.  Also reviewed home safety and car transfers: pt able to verbalize understanding appropriately.  Pt appears safe to discharge home today with support from family.  DME delivered and RW fit for pt prior to discharge.   Recommendations for follow up therapy are one component of a multi-disciplinary discharge planning process, led by the attending physician.  Recommendations may be updated based on patient status, additional functional criteria and insurance authorization.  Follow Up Recommendations  Home health PT     Assistance Recommended at Discharge Set up Supervision/Assistance  Patient can return home with the following A little help with walking and/or transfers;A little help with bathing/dressing/bathroom;Assistance with cooking/housework;Assist for transportation;Help with stairs or ramp for entrance   Equipment Recommendations  Rolling walker (2 wheels);BSC/3in1 (youth sized)    Recommendations for Other Services OT consult     Precautions / Restrictions Precautions Precautions: Fall;Posterior Hip Precaution Booklet Issued: Yes (comment) Precaution Comments: Legally blind Restrictions Weight Bearing  Restrictions: Yes LLE Weight Bearing: Weight bearing as tolerated     Mobility  Bed Mobility          Transfers Overall transfer level: Needs assistance Equipment used: Rolling walker (2 wheels) Transfers: Sit to/from Stand Sit to Stand: Modified independent (Device/Increase time)           General transfer comment: steady transfer from recliner    Ambulation/Gait Ambulation/Gait assistance: Supervision Gait Distance (Feet): 160 Feet Assistive device: Rolling walker (2 wheels)   Gait velocity: decreased     General Gait Details: antalgic; decreased stance time L LE; flexed posture (pt reports chronic d/t L hip pain); decreased B LE step length; steady   Stairs   Wheelchair Mobility    Modified Rankin (Stroke Patients Only)       Balance Overall balance assessment: Needs assistance Sitting-balance support: No upper extremity supported, Feet supported Sitting balance-Leahy Scale: Good Sitting balance - Comments: steady sitting reaching within BOS   Standing balance support: Bilateral upper extremity supported, During functional activity, Reliant on assistive device for balance Standing balance-Leahy Scale: Good Standing balance comment: steady ambulating with RW use                            Cognition Arousal/Alertness: Awake/alert Behavior During Therapy: WFL for tasks assessed/performed Overall Cognitive Status: Within Functional Limits for tasks assessed                                          Exercises     General Comments  Pt agreeable to PT session; pt's husband present during session.      Pertinent  Vitals/Pain Pain Assessment Pain Assessment: 0-10 Pain Score: 2  Pain Location: L hip Pain Descriptors / Indicators: Tender, Sore Pain Intervention(s): Limited activity within patient's tolerance, Monitored during session, Repositioned    Home Living        Prior Function            PT Goals (current  goals can now be found in the care plan section) Acute Rehab PT Goals Patient Stated Goal: to improve strength and mobility PT Goal Formulation: With patient Time For Goal Achievement: 10/04/21 Potential to Achieve Goals: Good Progress towards PT goals: Progressing toward goals    Frequency    BID      PT Plan Current plan remains appropriate    Co-evaluation              AM-PAC PT "6 Clicks" Mobility   Outcome Measure  Help needed turning from your back to your side while in a flat bed without using bedrails?: A Little Help needed moving from lying on your back to sitting on the side of a flat bed without using bedrails?: A Little Help needed moving to and from a bed to a chair (including a wheelchair)?: A Little Help needed standing up from a chair using your arms (e.g., wheelchair or bedside chair)?: A Little Help needed to walk in hospital room?: A Little Help needed climbing 3-5 steps with a railing? : A Little 6 Click Score: 18    End of Session Equipment Utilized During Treatment: Gait belt Activity Tolerance: Patient tolerated treatment well Patient left: in chair;with call bell/phone within reach;with chair alarm set;with SCD's reapplied;Other (comment) (B heels floating via pillow support; pillow between pt's knees for posterior THP's) Nurse Communication: Mobility status;Precautions;Weight bearing status PT Visit Diagnosis: Other abnormalities of gait and mobility (R26.89);Muscle weakness (generalized) (M62.81);Pain Pain - Right/Left: Left Pain - part of body: Hip     Time: 8657-8469 PT Time Calculation (min) (ACUTE ONLY): 12 min  Charges:  $Therapeutic Activity: 8-22 mins                     Hendricks Limes, PT 09/21/21, 2:32 PM

## 2021-09-21 NOTE — Progress Notes (Cosign Needed)
Patient is not able to walk the distance required to go the bathroom, or he/she is unable to safely negotiate stairs required to access the bathroom.  A 3in1 BSC will alleviate this problem  

## 2021-09-21 NOTE — Progress Notes (Signed)
Orthopaedics: Patient is not able to walk the distance required to go the bathroom, or he/she is unable to safely negotiate stairs required to access the bathroom.  A 3in1 BSC will alleviate this problem    Orman Matsumura P. Richie Bonanno, Jr. M.D.  

## 2021-09-21 NOTE — TOC Transition Note (Signed)
Transition of Care [facility]) - CM/SW Discharge Note   Patient Details  Name: Amy Meyer MRN: [id number] Date of Birth: 1955-07-28  Transition of Care [facility] CM/SW Contact:  Truddie Hidden, RN Phone Number: 09/21/2021, 2:16 PM   Clinical Narrative:    Spoke with patient regarding discharge and physical therapy recommendation. Patient is agreeable to services. She did not voice a preference of HH agencies. Referral sent and accepted by [facility]. RW and BSC requested via [employer]. TOC signing off.           Patient Goals and CMS Choice        Discharge Placement                       Discharge Plan and Services                                     Social Determinants of Health (SDOH) Interventions     Readmission Risk Interventions     No data to display

## 2021-09-21 NOTE — Progress Notes (Signed)
Physical Therapy Treatment Patient Details Name: Amy Meyer MRN: [id number] DOB: 1955/08/07 Today's Date: 09/21/2021   History of Present Illness Pt is a 66 y.o. female s/p L THA secondary to degenerative arthrosis 09/20/21.  PMH includes htn, DM, adult onset vitelliform macular dystrophy, CKD, legally blind, lymphedema.    PT Comments    Pt resting in recliner upon PT arrival; agreeable to PT session.  5/10 L hip pain with activity but 3/10 at rest end of session.  Reviewed THR written HEP with pt: pt demonstrating appropriate understanding and reports able to read issued written HEP.  Also reviewed posterior THP's: pt initially requiring education but pt able to state 3/3 precautions end of session.  During session pt SBA with bed mobility; SBA with transfers using RW; SBA ambulating 120 feet x2 with RW use; and CGA navigating 6 steps with L railing.  Pt demonstrating continued flexed posture during ambulation (pt reports chronic d/t L hip pain) and youth sized walker appeared to improve gait quality (pt reports youth sized walker a better fit).  Pt appears safe to discharge home from PT standpoint (PA and pt's nurse notified); TOC notified of DME needs.   Recommendations for follow up therapy are one component of a multi-disciplinary discharge planning process, led by the attending physician.  Recommendations may be updated based on patient status, additional functional criteria and insurance authorization.  Follow Up Recommendations  Home health PT     Assistance Recommended at Discharge Set up Supervision/Assistance  Patient can return home with the following A little help with walking and/or transfers;A little help with bathing/dressing/bathroom;Assistance with cooking/housework;Assist for transportation;Help with stairs or ramp for entrance   Equipment Recommendations  Rolling walker (2 wheels);BSC/3in1 (youth sized)    Recommendations for Other Services OT consult      Precautions / Restrictions Precautions Precautions: Fall;Posterior Hip Precaution Booklet Issued: Yes (comment) Precaution Comments: Legally blind Restrictions Weight Bearing Restrictions: Yes LLE Weight Bearing: Weight bearing as tolerated     Mobility  Bed Mobility Overal bed mobility: Needs Assistance Bed Mobility: Supine to Sit, Sit to Supine     Supine to sit: Supervision Sit to supine: Supervision   General bed mobility comments: bed flat; increased effort to perform on own    Transfers Overall transfer level: Needs assistance Equipment used: Rolling walker (2 wheels) Transfers: Sit to/from Stand Sit to Stand: Supervision           General transfer comment: vc's for UE/LE placement initially; x1 trial from recliner, x1 trial from bed, x1 trial from mat table    Ambulation/Gait Ambulation/Gait assistance: Supervision Gait Distance (Feet):  (120 feet x2) Assistive device: Rolling walker (2 wheels)   Gait velocity: decreased     General Gait Details: antalgic; decreased stance time L LE; flexed posture (pt reports chronic d/t L hip pain); decreased B LE step length   Stairs Stairs: Yes Stairs assistance: Min guard Stair Management: One rail Left, Step to pattern, Sideways Number of Stairs: 6 General stair comments: initial vc's for technique; steady safe stairs navigation   Wheelchair Mobility    Modified Rankin (Stroke Patients Only)       Balance Overall balance assessment: Needs assistance Sitting-balance support: No upper extremity supported, Feet supported Sitting balance-Leahy Scale: Good Sitting balance - Comments: steady sitting reaching within BOS   Standing balance support: Bilateral upper extremity supported, During functional activity, Reliant on assistive device for balance Standing balance-Leahy Scale: Good Standing balance comment: steady ambulating with RW use  Cognition  Arousal/Alertness: Awake/alert Behavior During Therapy: WFL for tasks assessed/performed Overall Cognitive Status: Within Functional Limits for tasks assessed                                          Exercises Total Joint Exercises Ankle Circles/Pumps: AROM, Strengthening, Both, 10 reps, Supine Quad Sets: AROM, Strengthening, Both, 10 reps, Supine Gluteal Sets: AROM, Strengthening, Both, 10 reps, Supine Towel Squeeze: AROM, Strengthening, Both, 10 reps, Supine (pillow between knees) Short Arc Quad: AROM, Strengthening, Left, 10 reps, Supine Heel Slides: AAROM, Strengthening, Left, 10 reps, Supine Hip ABduction/ADduction: AAROM, Strengthening, Left, 10 reps, Supine Long Arc Quad: AROM, Strengthening, Left, 10 reps, Seated    General Comments  Pt agreeable to PT session.      Pertinent Vitals/Pain Pain Assessment Pain Assessment: 0-10 Pain Score: 3  Pain Location: L hip Pain Descriptors / Indicators: Tender, Sore Pain Intervention(s): Limited activity within patient's tolerance, Monitored during session, Premedicated before session, Repositioned Vitals (HR and O2 on room air) stable and WFL throughout treatment session.    Home Living                          Prior Function            PT Goals (current goals can now be found in the care plan section) Acute Rehab PT Goals Patient Stated Goal: to improve strength and mobility PT Goal Formulation: With patient Time For Goal Achievement: 10/04/21 Potential to Achieve Goals: Good Progress towards PT goals: Progressing toward goals    Frequency    BID      PT Plan Current plan remains appropriate    Co-evaluation              AM-PAC PT "6 Clicks" Mobility   Outcome Measure  Help needed turning from your back to your side while in a flat bed without using bedrails?: A Little Help needed moving from lying on your back to sitting on the side of a flat bed without using bedrails?: A  Little Help needed moving to and from a bed to a chair (including a wheelchair)?: A Little Help needed standing up from a chair using your arms (e.g., wheelchair or bedside chair)?: A Little Help needed to walk in hospital room?: A Little Help needed climbing 3-5 steps with a railing? : A Little 6 Click Score: 18    End of Session Equipment Utilized During Treatment: Gait belt Activity Tolerance: Patient tolerated treatment well Patient left: in chair;with call bell/phone within reach;with chair alarm set;with SCD's reapplied;Other (comment) (pillow between pt's knees; B heels floating via pillow support) Nurse Communication: Mobility status;Precautions;Weight bearing status PT Visit Diagnosis: Other abnormalities of gait and mobility (R26.89);Muscle weakness (generalized) (M62.81);Pain Pain - Right/Left: Left Pain - part of body: Hip     Time: 2993-7169 PT Time Calculation (min) (ACUTE ONLY): 42 min  Charges:  $Gait Training: 8-22 mins $Therapeutic Exercise: 8-22 mins $Therapeutic Activity: 8-22 mins                    Hendricks Limes, PT 09/21/21, 10:58 AM

## 2021-09-21 NOTE — Anesthesia Postprocedure Evaluation (Signed)
Anesthesia Post Note  Patient: Amy Meyer  Procedure(s) Performed: TOTAL HIP ARTHROPLASTY (Left: Hip)  Patient location during evaluation: Nursing Unit Anesthesia Type: Spinal Level of consciousness: oriented and awake and alert Pain management: pain level controlled Vital Signs Assessment: post-procedure vital signs reviewed and stable Respiratory status: spontaneous breathing and respiratory function stable Cardiovascular status: blood pressure returned to baseline and stable Postop Assessment: no headache, no backache, no apparent nausea or vomiting and patient able to bend at knees Anesthetic complications: no   No notable events documented.   Last Vitals:  Vitals:   09/21/21 0428 09/21/21 0727  BP: 136/64 130/62  Pulse: 93 76  Resp: 17 16  Temp: 36.5 C 36.7 C  SpO2: 97% 99%    Last Pain:  Vitals:   09/21/21 0744  TempSrc:   PainSc: 1                  Lynden Oxford

## 2021-09-21 NOTE — Discharge Summary (Signed)
Physician Discharge Summary  Patient ID: Amy Meyer MRN: [id number] DOB/AGE: [birth year] 66 y.o.  Admit date: 09/20/2021 Discharge date: 09/21/2021  Admission Diagnoses:  Hx of total hip arthroplasty, left [Z96.642]  Surgeries:Procedure(s): Left total hip arthroplasty   SURGEON:  Marciano Sequin. M.D.   ASSISTANT: Cassell Smiles, PA-C (present and scrubbed throughout the case, critical for assistance with exposure, retraction, instrumentation, and closure)   ANESTHESIA: spinal   ESTIMATED BLOOD LOSS: 150 mL   FLUIDS REPLACED: 1300 mL of crystalloid   DRAINS: 2 medium Hemovac drains   IMPLANTS UTILIZED: DePuy 12 mm small stature AML femoral stem, 50 mm OD Pinnacle 100 acetabular component, +4 mm 10 degree Pinnacle Marathon polyethylene insert, and a 32 mm CoCr +1 mm hip ball    Discharge Diagnoses: Patient Active Problem List   Diagnosis Date Noted   Best vitelliform macular dystrophy 09/20/2021   Essential hypertension 09/20/2021   Stage 3 chronic kidney disease ([place]) 09/20/2021   Hx of total hip arthroplasty, left 09/20/2021   Primary osteoarthritis of left hip 07/18/2021   Vitamin D deficiency 02/19/2018   Controlled type 2 diabetes mellitus with stage 3 chronic kidney disease, without long-term current use of insulin ([place]) 01/27/2015   Chronic acquired lymphedema 07/24/2013   Hyperlipidemia associated with type 2 diabetes mellitus ([place]) 05/27/2013   Hypothyroid 05/27/2013    Past Medical History:  Diagnosis Date   Adult onset vitelliform macular dystrophy    Arthritis    Chronic kidney disease    stage 3 chronic kidney disease   Hyperlipidemia    Hypertension    Hypothyroidism    Legally blind    Lymphedema    Pre-diabetes      Transfusion:    Consultants (if any):   Discharged Condition: Improved  Hospital Course: Amy Meyer is an 66 y.o. female who was admitted 09/20/2021 with a diagnosis of left hip osteoarthritis and went to the  operating room on 09/20/2021 and underwent left total hip arthroplasty through posterior approach. The patient received perioperative antibiotics for prophylaxis (see below). The patient tolerated the procedure well and was transported to PACU in stable condition. After meeting PACU criteria, the patient was subsequently transferred to the Orthopaedics/Rehabilitation unit.   The patient received DVT prophylaxis in the form of early mobilization, Lovenox, TED hose, and SCDs . A sacral pad had been placed and heels were elevated off of the bed with rolled towels in order to protect skin integrity. Foley catheter was discontinued on postoperative day #0. Wound drains were discontinued on postoperative day #1. The surgical incision was healing well without signs of infection.  Physical therapy was initiated postoperatively for transfers, gait training, and strengthening. Occupational therapy was initiated for activities of daily living and evaluation for assisted devices. Rehabilitation goals were reviewed in detail with the patient. The patient made steady progress with physical therapy and physical therapy recommended discharge to Home.   The patient achieved the preliminary goals of this hospitalization and was felt to be medically and orthopaedically appropriate for discharge.  She was given perioperative antibiotics:  Anti-infectives (From admission, onward)    Start     Dose/Rate Route Frequency Ordered Stop   09/20/21 1330  ceFAZolin (ANCEF) IVPB 2g/100 mL premix        2 g 200 mL/hr over 30 Minutes Intravenous Every 6 hours 09/20/21 1257 09/20/21 2036   09/20/21 0626  ceFAZolin (ANCEF) 2-4 GM/100ML-% IVPB       Note to Pharmacy: Trudie Reed  S: cabinet override      09/20/21 0626 09/20/21 0743   09/20/21 0600  ceFAZolin (ANCEF) IVPB 2g/100 mL premix        2 g 200 mL/hr over 30 Minutes Intravenous On call to O.R. 09/19/21 2208 09/20/21 0750     .  Recent vital signs:  Vitals:   09/21/21  0428 09/21/21 0727  BP: 136/64 130/62  Pulse: 93 76  Resp: 17 16  Temp: 97.7 F (36.5 C) 98.1 F (36.7 C)  SpO2: 97% 99%    Recent laboratory studies:  No results for input(s): "WBC", "HGB", "HCT", "PLT", "K", "CL", "CO2", "BUN", "CREATININE", "GLUCOSE", "CALCIUM", "LABPT", "INR" in the last 72 hours.  Diagnostic Studies: DG Hip Port Unilat With Pelvis 1V Left  Result Date: 09/20/2021 CLINICAL DATA:  Status post total left hip arthroplasty. EXAM: DG HIP (WITH OR WITHOUT PELVIS) 1V PORT LEFT COMPARISON:  None Available. FINDINGS: Postsurgical changes of recent total left hip arthroplasty. No perihardware lucency is seen to indicate hardware failure or loosening. Expected postoperative changes including lateral hip soft tissue swelling and mild scattered subcutaneous air. There is a left hip postsurgical drain. Lateral skin staples. IMPRESSION: Status post total left hip arthroplasty. No evidence of hardware failure. Electronically Signed   By: Neita Garnet M.D.   On: 09/20/2021 13:08    Discharge Medications:   Allergies as of 09/21/2021   No Known Allergies      Medication List     STOP taking these medications    aspirin EC 325 MG tablet       TAKE these medications    acetaminophen 500 MG tablet Commonly known as: TYLENOL Take 1,000 mg by mouth every 8 (eight) hours as needed for mild pain.   atorvastatin 40 MG tablet Commonly known as: LIPITOR Take 40 mg by mouth daily.   celecoxib 200 MG capsule Commonly known as: CELEBREX Take 1 capsule (200 mg total) by mouth 2 (two) times daily.   enoxaparin 40 MG/0.4ML injection Commonly known as: LOVENOX Inject 0.4 mLs (40 mg total) into the skin daily for 14 days.   fexofenadine 180 MG tablet Commonly known as: ALLEGRA Take 180 mg by mouth daily.   finasteride 5 MG tablet Commonly known as: PROSCAR Take 5 mg by mouth daily. 1/2 tablet   hydrochlorothiazide 25 MG tablet Commonly known as: HYDRODIURIL Take 25 mg by  mouth daily.   levocetirizine 5 MG tablet Commonly known as: XYZAL Take 5 mg by mouth every evening.   levothyroxine 88 MCG tablet Commonly known as: SYNTHROID Take 88 mcg by mouth daily before breakfast.   oxyCODONE 5 MG immediate release tablet Commonly known as: Oxy IR/ROXICODONE Take 1 tablet (5 mg total) by mouth every 4 (four) hours as needed for severe pain.   traMADol 50 MG tablet Commonly known as: ULTRAM Take 1 tablet (50 mg total) by mouth every 4 (four) hours as needed for moderate pain.   triamcinolone cream 0.1 % Commonly known as: KENALOG Apply 1 Application topically 2 (two) times daily.   VITAMIN D2 PO Take 1 capsule by mouth. Every 14 days               Durable Medical Equipment  (From admission, onward)           Start     Ordered   09/20/21 1257  DME Walker rolling  Once       Question:  Patient needs a walker to treat with the following condition  Answer:  S/P total hip arthroplasty   09/20/21 1257   09/20/21 1257  DME Bedside commode  Once       Question:  Patient needs a bedside commode to treat with the following condition  Answer:  S/P total hip arthroplasty   09/20/21 1257            Disposition: Home with home health PT     Follow-up Information     Hooten, Illene Labrador, MD Follow up on 11/02/2021.   Specialty: Orthopedic Surgery Why: at 2:15pm Contact information: [address] [facility] [place] [address] [phone]                  Lasandra Beech, PA-C 09/21/2021, 11:35 AM

## 2021-09-21 NOTE — Progress Notes (Addendum)
    Durable Medical Equipment  (From admission, onward)           Start     Ordered   09/20/21 1257  DME Walker rolling  Once       Question:  Patient needs a walker to treat with the following condition  Answer:  S/P total hip arthroplasty   09/20/21 1257   09/20/21 1257  DME Bedside commode  Once       Question:  Patient needs a bedside commode to treat with the following condition  Answer:  S/P total hip arthroplasty   09/20/21 1257

## 2021-09-22 DIAGNOSIS — Z471 Aftercare following joint replacement surgery: Secondary | ICD-10-CM | POA: Diagnosis not present

## 2021-09-22 DIAGNOSIS — E1122 Type 2 diabetes mellitus with diabetic chronic kidney disease: Secondary | ICD-10-CM | POA: Diagnosis not present

## 2021-09-22 DIAGNOSIS — I129 Hypertensive chronic kidney disease with stage 1 through stage 4 chronic kidney disease, or unspecified chronic kidney disease: Secondary | ICD-10-CM | POA: Diagnosis not present

## 2021-09-22 DIAGNOSIS — E039 Hypothyroidism, unspecified: Secondary | ICD-10-CM | POA: Diagnosis not present

## 2021-09-22 DIAGNOSIS — I890 Lymphedema, not elsewhere classified: Secondary | ICD-10-CM | POA: Diagnosis not present

## 2021-09-22 DIAGNOSIS — E1169 Type 2 diabetes mellitus with other specified complication: Secondary | ICD-10-CM | POA: Diagnosis not present

## 2021-09-22 DIAGNOSIS — E785 Hyperlipidemia, unspecified: Secondary | ICD-10-CM | POA: Diagnosis not present

## 2021-09-22 DIAGNOSIS — H548 Legal blindness, as defined in USA: Secondary | ICD-10-CM | POA: Diagnosis not present

## 2021-09-22 DIAGNOSIS — E559 Vitamin D deficiency, unspecified: Secondary | ICD-10-CM | POA: Diagnosis not present

## 2021-09-22 LAB — SURGICAL PATHOLOGY

## 2021-10-07 DIAGNOSIS — Z471 Aftercare following joint replacement surgery: Secondary | ICD-10-CM | POA: Diagnosis not present

## 2021-10-07 DIAGNOSIS — H548 Legal blindness, as defined in USA: Secondary | ICD-10-CM | POA: Diagnosis not present

## 2021-10-07 DIAGNOSIS — E1122 Type 2 diabetes mellitus with diabetic chronic kidney disease: Secondary | ICD-10-CM | POA: Diagnosis not present

## 2021-10-07 DIAGNOSIS — I129 Hypertensive chronic kidney disease with stage 1 through stage 4 chronic kidney disease, or unspecified chronic kidney disease: Secondary | ICD-10-CM | POA: Diagnosis not present

## 2021-10-07 DIAGNOSIS — E785 Hyperlipidemia, unspecified: Secondary | ICD-10-CM | POA: Diagnosis not present

## 2021-10-07 DIAGNOSIS — E1169 Type 2 diabetes mellitus with other specified complication: Secondary | ICD-10-CM | POA: Diagnosis not present

## 2021-10-07 DIAGNOSIS — I890 Lymphedema, not elsewhere classified: Secondary | ICD-10-CM | POA: Diagnosis not present

## 2021-10-07 DIAGNOSIS — E559 Vitamin D deficiency, unspecified: Secondary | ICD-10-CM | POA: Diagnosis not present

## 2021-10-07 DIAGNOSIS — E039 Hypothyroidism, unspecified: Secondary | ICD-10-CM | POA: Diagnosis not present

## 2021-10-27 DIAGNOSIS — H353122 Nonexudative age-related macular degeneration, left eye, intermediate dry stage: Secondary | ICD-10-CM | POA: Diagnosis not present

## 2021-11-02 DIAGNOSIS — Z96642 Presence of left artificial hip joint: Secondary | ICD-10-CM | POA: Diagnosis not present

## 2021-11-24 DIAGNOSIS — L508 Other urticaria: Secondary | ICD-10-CM | POA: Diagnosis not present

## 2021-12-06 DIAGNOSIS — H3554 Dystrophies primarily involving the retinal pigment epithelium: Secondary | ICD-10-CM | POA: Diagnosis not present

## 2021-12-06 DIAGNOSIS — Z0100 Encounter for examination of eyes and vision without abnormal findings: Secondary | ICD-10-CM | POA: Diagnosis not present

## 2021-12-06 DIAGNOSIS — E119 Type 2 diabetes mellitus without complications: Secondary | ICD-10-CM | POA: Diagnosis not present

## 2021-12-06 DIAGNOSIS — H2513 Age-related nuclear cataract, bilateral: Secondary | ICD-10-CM | POA: Diagnosis not present

## 2021-12-31 ENCOUNTER — Other Ambulatory Visit: Payer: Self-pay | Admitting: Obstetrics and Gynecology

## 2021-12-31 DIAGNOSIS — Z124 Encounter for screening for malignant neoplasm of cervix: Secondary | ICD-10-CM | POA: Diagnosis not present

## 2021-12-31 DIAGNOSIS — Z1231 Encounter for screening mammogram for malignant neoplasm of breast: Secondary | ICD-10-CM | POA: Diagnosis not present

## 2022-01-04 ENCOUNTER — Encounter: Payer: Self-pay | Admitting: Ophthalmology

## 2022-01-05 DIAGNOSIS — H2512 Age-related nuclear cataract, left eye: Secondary | ICD-10-CM | POA: Diagnosis not present

## 2022-01-05 NOTE — Discharge Instructions (Signed)

## 2022-01-10 NOTE — Anesthesia Preprocedure Evaluation (Signed)
Anesthesia Evaluation  Patient identified by MRN, date of birth, ID band Patient awake    Reviewed: Allergy & Precautions, H&P , NPO status , Patient's Chart, lab work & pertinent test results, reviewed documented beta blocker date and time   Airway Mallampati: II  TM Distance: >3 FB Neck ROM: full    Dental no notable dental hx. (+) Teeth Intact   Pulmonary neg pulmonary ROS   Pulmonary exam normal breath sounds clear to auscultation       Cardiovascular Exercise Tolerance: Good hypertension, On Medications negative cardio ROS  Rhythm:regular Rate:Normal     Neuro/Psych negative neurological ROS  negative psych ROS   GI/Hepatic negative GI ROS, Neg liver ROS,,,  Endo/Other  diabetesHypothyroidism    Renal/GU CRFRenal disease     Musculoskeletal   Abdominal   Peds  Hematology negative hematology ROS (+)   Anesthesia Other Findings   Reproductive/Obstetrics negative OB ROS                             Anesthesia Physical Anesthesia Plan  ASA: 3  Anesthesia Plan: MAC   Post-op Pain Management:    Induction:   PONV Risk Score and Plan:   Airway Management Planned:   Additional Equipment:   Intra-op Plan:   Post-operative Plan:   Informed Consent: I have reviewed the patients History and Physical, chart, labs and discussed the procedure including the risks, benefits and alternatives for the proposed anesthesia with the patient or authorized representative who has indicated his/her understanding and acceptance.       Plan Discussed with: CRNA  Anesthesia Plan Comments:        Anesthesia Quick Evaluation

## 2022-01-11 ENCOUNTER — Ambulatory Visit
Admission: RE | Admit: 2022-01-11 | Discharge: 2022-01-11 | Disposition: A | Discharge status: Home / Self Care | Payer: Medicare HMO | Source: Ambulatory Visit | Attending: Ophthalmology | Admitting: Ophthalmology

## 2022-01-11 ENCOUNTER — Encounter
Admission: RE | Disposition: A | Discharge status: Home / Self Care | Payer: Self-pay | Source: Ambulatory Visit | Attending: Ophthalmology

## 2022-01-11 ENCOUNTER — Encounter: Payer: Self-pay | Admitting: Ophthalmology

## 2022-01-11 ENCOUNTER — Ambulatory Visit: Payer: Medicare HMO | Admitting: General Practice

## 2022-01-11 ENCOUNTER — Other Ambulatory Visit: Payer: Self-pay

## 2022-01-11 DIAGNOSIS — E039 Hypothyroidism, unspecified: Secondary | ICD-10-CM | POA: Diagnosis not present

## 2022-01-11 DIAGNOSIS — E785 Hyperlipidemia, unspecified: Secondary | ICD-10-CM | POA: Insufficient documentation

## 2022-01-11 DIAGNOSIS — Z79890 Hormone replacement therapy (postmenopausal): Secondary | ICD-10-CM | POA: Diagnosis not present

## 2022-01-11 DIAGNOSIS — E1122 Type 2 diabetes mellitus with diabetic chronic kidney disease: Secondary | ICD-10-CM | POA: Insufficient documentation

## 2022-01-11 DIAGNOSIS — N183 Chronic kidney disease, stage 3 unspecified: Secondary | ICD-10-CM | POA: Insufficient documentation

## 2022-01-11 DIAGNOSIS — H2512 Age-related nuclear cataract, left eye: Secondary | ICD-10-CM | POA: Diagnosis not present

## 2022-01-11 DIAGNOSIS — I129 Hypertensive chronic kidney disease with stage 1 through stage 4 chronic kidney disease, or unspecified chronic kidney disease: Secondary | ICD-10-CM | POA: Insufficient documentation

## 2022-01-11 DIAGNOSIS — I10 Essential (primary) hypertension: Secondary | ICD-10-CM | POA: Diagnosis not present

## 2022-01-11 DIAGNOSIS — E1136 Type 2 diabetes mellitus with diabetic cataract: Secondary | ICD-10-CM | POA: Insufficient documentation

## 2022-01-11 DIAGNOSIS — E119 Type 2 diabetes mellitus without complications: Secondary | ICD-10-CM | POA: Diagnosis not present

## 2022-01-11 HISTORY — PX: CATARACT EXTRACTION W/PHACO: SHX586

## 2022-01-11 SURGERY — PHACOEMULSIFICATION, CATARACT, WITH IOL INSERTION
Anesthesia: Monitor Anesthesia Care | Site: Eye | Laterality: Left

## 2022-01-11 MED ORDER — SIGHTPATH DOSE#1 BSS IO SOLN
INTRAOCULAR | Status: DC | PRN
  Administered 2022-01-11: 2 mL

## 2022-01-11 MED ORDER — SIGHTPATH DOSE#1 BSS IO SOLN
INTRAOCULAR | Status: DC | PRN
  Administered 2022-01-11: 15 mL via INTRAOCULAR

## 2022-01-11 MED ORDER — TETRACAINE HCL 0.5 % OP SOLN
1.0000 [drp] | OPHTHALMIC | Status: DC | PRN
  Administered 2022-01-11 (×3): 1 [drp] via OPHTHALMIC

## 2022-01-11 MED ORDER — MIDAZOLAM HCL 2 MG/2ML IJ SOLN
INTRAMUSCULAR | Status: DC | PRN
  Administered 2022-01-11: 2 mg via INTRAVENOUS

## 2022-01-11 MED ORDER — FENTANYL CITRATE (PF) 100 MCG/2ML IJ SOLN
INTRAMUSCULAR | Status: DC | PRN
  Administered 2022-01-11 (×2): 50 ug via INTRAVENOUS

## 2022-01-11 MED ORDER — ONDANSETRON HCL 4 MG/2ML IJ SOLN: 4.0000 mg | Freq: Once | INTRAMUSCULAR | Status: DC | PRN

## 2022-01-11 MED ORDER — FENTANYL CITRATE PF 50 MCG/ML IJ SOSY: 25.0000 ug | PREFILLED_SYRINGE | INTRAMUSCULAR | Status: DC | PRN

## 2022-01-11 MED ORDER — BRIMONIDINE TARTRATE-TIMOLOL 0.2-0.5 % OP SOLN
OPHTHALMIC | Status: DC | PRN
  Administered 2022-01-11: 1 [drp] via OPHTHALMIC

## 2022-01-11 MED ORDER — LACTATED RINGERS IV SOLN: INTRAVENOUS | Status: DC

## 2022-01-11 MED ORDER — SIGHTPATH DOSE#1 NA CHONDROIT SULF-NA HYALURON 40-17 MG/ML IO SOLN
INTRAOCULAR | Status: DC | PRN
  Administered 2022-01-11: 1 mL via INTRAOCULAR

## 2022-01-11 MED ORDER — MOXIFLOXACIN HCL 0.5 % OP SOLN
OPHTHALMIC | Status: DC | PRN
  Administered 2022-01-11: .2 mL via OPHTHALMIC

## 2022-01-11 MED ORDER — ARMC OPHTHALMIC DILATING DROPS
1.0000 | OPHTHALMIC | Status: DC | PRN
  Administered 2022-01-11 (×3): 1 via OPHTHALMIC

## 2022-01-11 MED ORDER — SIGHTPATH DOSE#1 BSS IO SOLN
INTRAOCULAR | Status: DC | PRN
  Administered 2022-01-11: 42 mL via OPHTHALMIC

## 2022-01-11 SURGICAL SUPPLY — 11 items
CANNULA ANT/CHMB 27G (MISCELLANEOUS) IMPLANT
CANNULA ANT/CHMB 27GA (MISCELLANEOUS) IMPLANT
CATARACT SUITE SIGHTPATH (MISCELLANEOUS) ×1 IMPLANT
FEE CATARACT SUITE SIGHTPATH (MISCELLANEOUS) ×1 IMPLANT
GLOVE SURG ENC TEXT LTX SZ8 (GLOVE) ×1 IMPLANT
GLOVE SURG TRIUMPH 8.0 PF LTX (GLOVE) ×1 IMPLANT
LENS IOL TECNIS EYHANCE 26.0 (Intraocular Lens) IMPLANT
NDL FILTER BLUNT 18X1 1/2 (NEEDLE) ×1 IMPLANT
NEEDLE FILTER BLUNT 18X1 1/2 (NEEDLE) ×1 IMPLANT
SYR 3ML LL SCALE MARK (SYRINGE) ×1 IMPLANT
WATER STERILE IRR 250ML POUR (IV SOLUTION) ×1 IMPLANT

## 2022-01-11 NOTE — H&P (Signed)
[facility]   Primary Care Physician:  Lauro Regulus, MD Ophthalmologist: Dr. Druscilla Brownie  Pre-Procedure History & Physical: HPI:  Amy Meyer is a 66 y.o. female here for cataract surgery.   Past Medical History:  Diagnosis Date   Adult onset vitelliform macular dystrophy    Arthritis    Chronic kidney disease    stage 3 chronic kidney disease   Hyperlipidemia    Hypertension    Hypothyroidism    Legally blind    Lymphedema    Pre-diabetes     Past Surgical History:  Procedure Laterality Date   BREAST BIOPSY Right    bx/clip-neg   COLONOSCOPY WITH PROPOFOL N/A 02/13/2017   Procedure: COLONOSCOPY WITH PROPOFOL;  Surgeon: Scot Jun, MD;  Location: [facility] ENDOSCOPY;  Service: Endoscopy;  Laterality: N/A;   TOTAL HIP ARTHROPLASTY Left 09/20/2021   Procedure: TOTAL HIP ARTHROPLASTY;  Surgeon: Donato Heinz, MD;  Location: [facility] ORS;  Service: Orthopedics;  Laterality: Left;    Prior to Admission medications   Medication Sig Start Date End Date Taking? Authorizing Provider  acetaminophen (TYLENOL) 500 MG tablet Take 1,000 mg by mouth every 8 (eight) hours as needed for mild pain.   Yes [provider]  aspirin 325 MG tablet Take 325 mg by mouth daily.   Yes [provider]  atorvastatin (LIPITOR) 40 MG tablet Take 40 mg by mouth daily.   Yes [provider]  Ergocalciferol (VITAMIN D2 PO) Take 1,250 mcg by mouth every 14 (fourteen) days. Every 14 days   Yes [provider]  fexofenadine (ALLEGRA) 180 MG tablet Take 180 mg by mouth daily.   Yes [provider]  hydrochlorothiazide (HYDRODIURIL) 25 MG tablet Take 25 mg by mouth daily.   Yes [provider]  levocetirizine (XYZAL) 5 MG tablet Take 5 mg by mouth every evening.   Yes [provider]  levothyroxine (SYNTHROID, LEVOTHROID) 88 MCG tablet Take 88 mcg by mouth daily before breakfast.   Yes [provider]  triamcinolone  cream (KENALOG) 0.1 % Apply 1 Application topically 2 (two) times daily.   Yes [provider]  finasteride (PROSCAR) 5 MG tablet Take 5 mg by mouth daily. 1/2 tablet Patient not taking: Reported on 01/04/2022    [provider]  oxyCODONE (OXY IR/ROXICODONE) 5 MG immediate release tablet Take 1 tablet (5 mg total) by mouth every 4 (four) hours as needed for severe pain. Patient not taking: Reported on 01/04/2022 09/21/21   Lasandra Beech B, PA-C    Allergies as of 12/13/2021   (No Known Allergies)    Family History  Problem Relation Age of Onset   Breast cancer Paternal Aunt 61    Social History   Socioeconomic History   Marital status: Married    Spouse name: Mikey Bussing   Number of children: Not on file   Years of education: Not on file   Highest education level: Not on file  Occupational History   Not on file  Tobacco Use   Smoking status: Never   Smokeless tobacco: Never  Vaping Use   Vaping Use: Never used  Substance and Sexual Activity   Alcohol use: No   Drug use: No   Sexual activity: Not on file  Other Topics Concern   Not on file  Social History Narrative   Not on file   Social Determinants of Health   Financial Resource Strain: Not on file  Food Insecurity: Not on file  Transportation  Needs: Not on file  Physical Activity: Not on file  Stress: Not on file  Social Connections: Not on file  Intimate Partner Violence: Not on file    Review of Systems: See HPI, otherwise negative ROS  Physical Exam: BP (!) 146/66   Temp 97.9 F (36.6 C) (Tympanic)   Resp 18   Ht 5' 2.99" (1.6 m)   Wt 100.8 kg   SpO2 97%   BMI 39.39 kg/m  General:   Alert, cooperative in NAD Head:  Normocephalic and atraumatic. Respiratory:  Normal work of breathing. Cardiovascular:  RRR  Impression/Plan: Amy Meyer is here for cataract surgery.  Risks, benefits, limitations, and alternatives regarding cataract surgery have been reviewed with the  patient.  Questions have been answered.  All parties agreeable.   Galen Manila, MD  01/11/2022, 7:18 AM

## 2022-01-11 NOTE — Op Note (Signed)
PREOPERATIVE DIAGNOSIS:  Nuclear sclerotic cataract of the left eye.   POSTOPERATIVE DIAGNOSIS:  Nuclear sclerotic cataract of the left eye.   OPERATIVE PROCEDURE:ORPROCALL@   SURGEON:  Galen Manila, MD.   ANESTHESIA:  Anesthesiologist: Yevette Edwards, MD CRNA: Domenic Moras, CRNA  1.      Managed anesthesia care. 2.     0.37ml of Shugarcaine was instilled following the paracentesis   COMPLICATIONS:  None.   TECHNIQUE:   Stop and chop   DESCRIPTION OF PROCEDURE:  The patient was examined and consented in the preoperative holding area where the aforementioned topical anesthesia was applied to the left eye and then brought back to the Operating Room where the left eye was prepped and draped in the usual sterile ophthalmic fashion and a lid speculum was placed. A paracentesis was created with the side port blade and the anterior chamber was filled with viscoelastic. A near clear corneal incision was performed with the steel keratome. A continuous curvilinear capsulorrhexis was performed with a cystotome followed by the capsulorrhexis forceps. Hydrodissection and hydrodelineation were carried out with BSS on a blunt cannula. The lens was removed in a stop and chop  technique and the remaining cortical material was removed with the irrigation-aspiration handpiece. The capsular bag was inflated with viscoelastic and the Technis [id number] lens was placed in the capsular bag without complication. The remaining viscoelastic was removed from the eye with the irrigation-aspiration handpiece. The wounds were hydrated. The anterior chamber was flushed with BSS and the eye was inflated to physiologic pressure. 0.30ml Vigamox was placed in the anterior chamber. The wounds were found to be water tight. The eye was dressed with Combigan. The patient was given protective glasses to wear throughout the day and a shield with which to sleep tonight. The patient was also given drops with which to begin a drop  regimen today and will follow-up with me in one day. Implant Name Type Inv. Item Serial No. Manufacturer Lot No. LRB No. Used Action  LENS IOL TECNIS EYHANCE 26.0 - [id number] Intraocular Lens LENS IOL TECNIS EYHANCE 26.0 [id number] [employer]  Left 1 Implanted    Procedure(s): CATARACT EXTRACTION PHACO AND INTRAOCULAR LENS PLACEMENT (IOC) LEFT DIABETIC 5.57 00:37.2 (Left)  Electronically signed: Galen Manila 01/11/2022 7:51 AM

## 2022-01-11 NOTE — Transfer of Care (Signed)
Immediate Anesthesia Transfer of Care Note  Patient: Amy Meyer  Procedure(s) Performed: CATARACT EXTRACTION PHACO AND INTRAOCULAR LENS PLACEMENT ([facility]) LEFT DIABETIC 5.57 00:37.2 (Left: Eye)  Patient Location: PACU  Anesthesia Type: MAC  Level of Consciousness: awake, alert  and patient cooperative  Airway and Oxygen Therapy: Patient Spontanous Breathing and Patient connected to supplemental oxygen  Post-op Assessment: Post-op Vital signs reviewed, Patient's Cardiovascular Status Stable, Respiratory Function Stable, Patent Airway and No signs of Nausea or vomiting  Post-op Vital Signs: Reviewed and stable  Complications: No notable events documented.

## 2022-01-11 NOTE — Anesthesia Postprocedure Evaluation (Signed)
Anesthesia Post Note  Patient: Lafern Brinkley Musto  Procedure(s) Performed: CATARACT EXTRACTION PHACO AND INTRAOCULAR LENS PLACEMENT ([facility]) LEFT DIABETIC 5.57 00:37.2 (Left: Eye)  Patient location during evaluation: PACU Anesthesia Type: MAC Level of consciousness: awake and alert Pain management: pain level controlled Vital Signs Assessment: post-procedure vital signs reviewed and stable Respiratory status: spontaneous breathing, nonlabored ventilation, respiratory function stable and patient connected to nasal cannula oxygen Cardiovascular status: blood pressure returned to baseline and stable Postop Assessment: no apparent nausea or vomiting Anesthetic complications: no   No notable events documented.   Last Vitals:  Vitals:   01/11/22 0752 01/11/22 0755  BP: 127/72 (!) 130/54  Pulse: 75 77  Resp: 14 17  Temp: (!) 36.4 C (!) 36.4 C  SpO2: 97% 98%    Last Pain:  Vitals:   01/11/22 0755  TempSrc:   PainSc: 0-No pain                 Molli Barrows

## 2022-01-12 ENCOUNTER — Encounter: Payer: Self-pay | Admitting: Ophthalmology

## 2022-01-17 ENCOUNTER — Ambulatory Visit
Admission: RE | Admit: 2022-01-17 | Discharge: 2022-01-17 | Disposition: A | Discharge status: Home / Self Care | Payer: Medicare HMO | Source: Ambulatory Visit | Attending: Obstetrics and Gynecology | Admitting: Obstetrics and Gynecology

## 2022-01-17 DIAGNOSIS — Z1231 Encounter for screening mammogram for malignant neoplasm of breast: Secondary | ICD-10-CM | POA: Diagnosis not present

## 2022-01-17 DIAGNOSIS — H2511 Age-related nuclear cataract, right eye: Secondary | ICD-10-CM | POA: Diagnosis not present

## 2022-01-18 DIAGNOSIS — I152 Hypertension secondary to endocrine disorders: Secondary | ICD-10-CM | POA: Diagnosis not present

## 2022-01-18 DIAGNOSIS — E1159 Type 2 diabetes mellitus with other circulatory complications: Secondary | ICD-10-CM | POA: Diagnosis not present

## 2022-01-18 DIAGNOSIS — R0602 Shortness of breath: Secondary | ICD-10-CM | POA: Diagnosis not present

## 2022-01-18 DIAGNOSIS — E039 Hypothyroidism, unspecified: Secondary | ICD-10-CM | POA: Diagnosis not present

## 2022-01-19 ENCOUNTER — Other Ambulatory Visit
Admission: RE | Admit: 2022-01-19 | Discharge: 2022-01-19 | Disposition: A | Discharge status: Home / Self Care | Payer: Medicare HMO | Source: Ambulatory Visit | Attending: Internal Medicine | Admitting: Internal Medicine

## 2022-01-19 ENCOUNTER — Encounter: Payer: Self-pay | Admitting: Ophthalmology

## 2022-01-19 DIAGNOSIS — E039 Hypothyroidism, unspecified: Secondary | ICD-10-CM | POA: Diagnosis not present

## 2022-01-19 DIAGNOSIS — Z0000 Encounter for general adult medical examination without abnormal findings: Secondary | ICD-10-CM | POA: Diagnosis not present

## 2022-01-19 DIAGNOSIS — E1122 Type 2 diabetes mellitus with diabetic chronic kidney disease: Secondary | ICD-10-CM | POA: Diagnosis not present

## 2022-01-19 DIAGNOSIS — N183 Chronic kidney disease, stage 3 unspecified: Secondary | ICD-10-CM | POA: Diagnosis not present

## 2022-01-19 DIAGNOSIS — R0602 Shortness of breath: Secondary | ICD-10-CM | POA: Insufficient documentation

## 2022-01-19 DIAGNOSIS — E1169 Type 2 diabetes mellitus with other specified complication: Secondary | ICD-10-CM | POA: Diagnosis not present

## 2022-01-19 DIAGNOSIS — E785 Hyperlipidemia, unspecified: Secondary | ICD-10-CM | POA: Diagnosis not present

## 2022-01-19 DIAGNOSIS — I129 Hypertensive chronic kidney disease with stage 1 through stage 4 chronic kidney disease, or unspecified chronic kidney disease: Secondary | ICD-10-CM | POA: Diagnosis not present

## 2022-01-19 LAB — D-DIMER, QUANTITATIVE: D-Dimer, Quant: 0.85 ug/mL-FEU — ABNORMAL HIGH (ref 0.00–0.50)

## 2022-01-20 ENCOUNTER — Ambulatory Visit: Admission: RE | Admit: 2022-01-20 | Payer: Medicare HMO | Source: Ambulatory Visit

## 2022-01-20 ENCOUNTER — Other Ambulatory Visit: Payer: Self-pay | Admitting: Internal Medicine

## 2022-01-20 DIAGNOSIS — R0602 Shortness of breath: Secondary | ICD-10-CM

## 2022-01-20 DIAGNOSIS — R7989 Other specified abnormal findings of blood chemistry: Secondary | ICD-10-CM

## 2022-01-20 NOTE — Discharge Instructions (Signed)

## 2022-01-25 ENCOUNTER — Encounter
Admission: RE | Disposition: A | Discharge status: Home / Self Care | Payer: Self-pay | Source: Ambulatory Visit | Attending: Ophthalmology

## 2022-01-25 ENCOUNTER — Ambulatory Visit
Admission: RE | Admit: 2022-01-25 | Discharge: 2022-01-25 | Disposition: A | Discharge status: Home / Self Care | Payer: Medicare HMO | Source: Ambulatory Visit | Attending: Ophthalmology | Admitting: Ophthalmology

## 2022-01-25 ENCOUNTER — Ambulatory Visit: Payer: Medicare HMO | Admitting: Anesthesiology

## 2022-01-25 ENCOUNTER — Other Ambulatory Visit: Payer: Self-pay

## 2022-01-25 ENCOUNTER — Encounter: Payer: Self-pay | Admitting: Ophthalmology

## 2022-01-25 DIAGNOSIS — E785 Hyperlipidemia, unspecified: Secondary | ICD-10-CM | POA: Diagnosis not present

## 2022-01-25 DIAGNOSIS — H269 Unspecified cataract: Secondary | ICD-10-CM | POA: Diagnosis not present

## 2022-01-25 DIAGNOSIS — E039 Hypothyroidism, unspecified: Secondary | ICD-10-CM | POA: Diagnosis not present

## 2022-01-25 DIAGNOSIS — I129 Hypertensive chronic kidney disease with stage 1 through stage 4 chronic kidney disease, or unspecified chronic kidney disease: Secondary | ICD-10-CM | POA: Insufficient documentation

## 2022-01-25 DIAGNOSIS — H2511 Age-related nuclear cataract, right eye: Secondary | ICD-10-CM | POA: Diagnosis not present

## 2022-01-25 DIAGNOSIS — H548 Legal blindness, as defined in USA: Secondary | ICD-10-CM | POA: Insufficient documentation

## 2022-01-25 DIAGNOSIS — N183 Chronic kidney disease, stage 3 unspecified: Secondary | ICD-10-CM | POA: Diagnosis not present

## 2022-01-25 DIAGNOSIS — I10 Essential (primary) hypertension: Secondary | ICD-10-CM | POA: Diagnosis not present

## 2022-01-25 DIAGNOSIS — R7303 Prediabetes: Secondary | ICD-10-CM | POA: Diagnosis not present

## 2022-01-25 HISTORY — PX: CATARACT EXTRACTION W/PHACO: SHX586

## 2022-01-25 SURGERY — PHACOEMULSIFICATION, CATARACT, WITH IOL INSERTION
Anesthesia: Monitor Anesthesia Care | Site: Eye | Laterality: Right

## 2022-01-25 MED ORDER — SODIUM CHLORIDE 0.9% FLUSH
INTRAVENOUS | Status: DC | PRN
  Administered 2022-01-25: 10 mL via INTRAVENOUS

## 2022-01-25 MED ORDER — ARMC OPHTHALMIC DILATING DROPS
1.0000 | OPHTHALMIC | Status: DC | PRN
  Administered 2022-01-25 (×3): 1 via OPHTHALMIC

## 2022-01-25 MED ORDER — BRIMONIDINE TARTRATE-TIMOLOL 0.2-0.5 % OP SOLN
OPHTHALMIC | Status: DC | PRN
  Administered 2022-01-25: 1 [drp] via OPHTHALMIC

## 2022-01-25 MED ORDER — MIDAZOLAM HCL 2 MG/2ML IJ SOLN
INTRAMUSCULAR | Status: DC | PRN
  Administered 2022-01-25: 1.5 mg via INTRAVENOUS

## 2022-01-25 MED ORDER — TETRACAINE HCL 0.5 % OP SOLN
1.0000 [drp] | OPHTHALMIC | Status: DC | PRN
  Administered 2022-01-25 (×3): 1 [drp] via OPHTHALMIC

## 2022-01-25 MED ORDER — SIGHTPATH DOSE#1 BSS IO SOLN
INTRAOCULAR | Status: DC | PRN
  Administered 2022-01-25: 15 mL via INTRAOCULAR

## 2022-01-25 MED ORDER — SIGHTPATH DOSE#1 BSS IO SOLN
INTRAOCULAR | Status: DC | PRN
  Administered 2022-01-25: 2 mL

## 2022-01-25 MED ORDER — FENTANYL CITRATE (PF) 100 MCG/2ML IJ SOLN
INTRAMUSCULAR | Status: DC | PRN
  Administered 2022-01-25: 50 ug via INTRAVENOUS

## 2022-01-25 MED ORDER — SIGHTPATH DOSE#1 NA CHONDROIT SULF-NA HYALURON 40-17 MG/ML IO SOLN
INTRAOCULAR | Status: DC | PRN
  Administered 2022-01-25: 1 mL via INTRAOCULAR

## 2022-01-25 MED ORDER — SIGHTPATH DOSE#1 BSS IO SOLN
INTRAOCULAR | Status: DC | PRN
  Administered 2022-01-25: 56 mL via OPHTHALMIC

## 2022-01-25 MED ORDER — MOXIFLOXACIN HCL 0.5 % OP SOLN
OPHTHALMIC | Status: DC | PRN
  Administered 2022-01-25: .2 mL via OPHTHALMIC

## 2022-01-25 SURGICAL SUPPLY — 15 items
CANNULA ANT/CHMB 27G (MISCELLANEOUS) IMPLANT
CANNULA ANT/CHMB 27GA (MISCELLANEOUS) IMPLANT
CATARACT SUITE SIGHTPATH (MISCELLANEOUS) ×1 IMPLANT
FEE CATARACT SUITE SIGHTPATH (MISCELLANEOUS) ×1 IMPLANT
GLOVE SURG ENC TEXT LTX SZ8 (GLOVE) ×1 IMPLANT
GLOVE SURG TRIUMPH 8.0 PF LTX (GLOVE) ×1 IMPLANT
LENS IOL TECNIS EYHANCE 26.5 (Intraocular Lens) IMPLANT
NDL FILTER BLUNT 18X1 1/2 (NEEDLE) ×1 IMPLANT
NEEDLE FILTER BLUNT 18X1 1/2 (NEEDLE) ×1 IMPLANT
PACK VIT ANT 23G (MISCELLANEOUS) IMPLANT
RING MALYGIN (MISCELLANEOUS) IMPLANT
SUT ETHILON 10-0 CS-B-6CS-B-6 (SUTURE)
SUTURE EHLN 10-0 CS-B-6CS-B-6 (SUTURE) IMPLANT
SYR 3ML LL SCALE MARK (SYRINGE) ×1 IMPLANT
WATER STERILE IRR 250ML POUR (IV SOLUTION) ×1 IMPLANT

## 2022-01-25 NOTE — Anesthesia Postprocedure Evaluation (Signed)
Anesthesia Post Note  Patient: Amy Meyer  Procedure(s) Performed: CATARACT EXTRACTION PHACO AND INTRAOCULAR LENS PLACEMENT ([facility]) RIGHT DIABETIC 4.55 00:23.9 (Right: Eye)  Patient location during evaluation: PACU Anesthesia Type: MAC Level of consciousness: awake and alert Pain management: pain level controlled Vital Signs Assessment: post-procedure vital signs reviewed and stable Respiratory status: spontaneous breathing, nonlabored ventilation, respiratory function stable and patient connected to nasal cannula oxygen Cardiovascular status: stable and blood pressure returned to baseline Postop Assessment: no apparent nausea or vomiting Anesthetic complications: no   No notable events documented.   Last Vitals:  Vitals:   01/25/22 1108 01/25/22 1113  BP: (!) 151/61 (!) 141/63  Pulse: (!) 59 73  Resp: 12 18  Temp: 36.6 C   SpO2: 97% 98%    Last Pain:  Vitals:   01/25/22 1113  TempSrc:   PainSc: 0-No pain                 Arita Miss

## 2022-01-25 NOTE — Transfer of Care (Signed)
Immediate Anesthesia Transfer of Care Note  Patient: Amy Meyer  Procedure(s) Performed: CATARACT EXTRACTION PHACO AND INTRAOCULAR LENS PLACEMENT ([facility]) RIGHT DIABETIC 4.55 00:23.9 (Right: Eye)  Patient Location: PACU  Anesthesia Type: MAC  Level of Consciousness: awake, alert  and patient cooperative  Airway and Oxygen Therapy: Patient Spontanous Breathing and Patient connected to supplemental oxygen  Post-op Assessment: Post-op Vital signs reviewed, Patient's Cardiovascular Status Stable, Respiratory Function Stable, Patent Airway and No signs of Nausea or vomiting  Post-op Vital Signs: Reviewed and stable  Complications: No notable events documented.

## 2022-01-25 NOTE — Op Note (Signed)
PREOPERATIVE DIAGNOSIS:  Nuclear sclerotic cataract of the right eye.   POSTOPERATIVE DIAGNOSIS:  Cataract   OPERATIVE PROCEDURE:ORPROCALL@   SURGEON:  Galen Manila, MD.   ANESTHESIA:  Anesthesiologist: Corinda Gubler, MD CRNA: Cecile Hearing, CRNA  1.      Managed anesthesia care. 2.      0.38ml of Shugarcaine was instilled in the eye following the paracentesis.   COMPLICATIONS:  None.   TECHNIQUE:   Stop and chop   DESCRIPTION OF PROCEDURE:  The patient was examined and consented in the preoperative holding area where the aforementioned topical anesthesia was applied to the right eye and then brought back to the Operating Room where the right eye was prepped and draped in the usual sterile ophthalmic fashion and a lid speculum was placed. A paracentesis was created with the side port blade and the anterior chamber was filled with viscoelastic. A near clear corneal incision was performed with the steel keratome. A continuous curvilinear capsulorrhexis was performed with a cystotome followed by the capsulorrhexis forceps. Hydrodissection and hydrodelineation were carried out with BSS on a blunt cannula. The lens was removed in a stop and chop  technique and the remaining cortical material was removed with the irrigation-aspiration handpiece. The capsular bag was inflated with viscoelastic and the Technis ZCB00  lens was placed in the capsular bag without complication. The remaining viscoelastic was removed from the eye with the irrigation-aspiration handpiece. The wounds were hydrated. The anterior chamber was flushed with BSS and the eye was inflated to physiologic pressure. 0.16ml of Vigamox was placed in the anterior chamber. The wounds were found to be water tight. The eye was dressed with Combigan. The patient was given protective glasses to wear throughout the day and a shield with which to sleep tonight. The patient was also given drops with which to begin a drop regimen today and will  follow-up with me in one day. Implant Name Type Inv. Item Serial No. Manufacturer Lot No. LRB No. Used Action  LENS IOL TECNIS EYHANCE 26.5 - [id number] Intraocular Lens LENS IOL TECNIS EYHANCE 26.5 [id number] [employer]  Right 1 Implanted   Procedure(s): CATARACT EXTRACTION PHACO AND INTRAOCULAR LENS PLACEMENT (IOC) RIGHT DIABETIC 4.55 00:23.9 (Right)  Electronically signed: Galen Manila 01/25/2022 11:04 AM

## 2022-01-25 NOTE — Op Note (Signed)
PREOPERATIVE DIAGNOSIS:  Nuclear sclerotic cataract of the right eye.   POSTOPERATIVE DIAGNOSIS:  * No post-op diagnosis entered *   OPERATIVE PROCEDURE:ORPROCALL@   SURGEON:  Galen Manila, MD.   ANESTHESIA:  Anesthesiologist: Corinda Gubler, MD CRNA: Cecile Hearing, CRNA  1.      Managed anesthesia care. 2.      0.66ml of Shugarcaine was instilled in the eye following the paracentesis.   COMPLICATIONS:  None.   TECHNIQUE:   Stop and chop   DESCRIPTION OF PROCEDURE:  The patient was examined and consented in the preoperative holding area where the aforementioned topical anesthesia was applied to the right eye and then brought back to the Operating Room where the right eye was prepped and draped in the usual sterile ophthalmic fashion and a lid speculum was placed. A paracentesis was created with the side port blade and the anterior chamber was filled with viscoelastic. A near clear corneal incision was performed with the steel keratome. A continuous curvilinear capsulorrhexis was performed with a cystotome followed by the capsulorrhexis forceps. Hydrodissection and hydrodelineation were carried out with BSS on a blunt cannula. The lens was removed in a stop and chop  technique and the remaining cortical material was removed with the irrigation-aspiration handpiece. The capsular bag was inflated with viscoelastic and the Technis ZCB00  lens was placed in the capsular bag without complication. The remaining viscoelastic was removed from the eye with the irrigation-aspiration handpiece. The wounds were hydrated. The anterior chamber was flushed with BSS and the eye was inflated to physiologic pressure. 0.13ml of Vigamox was placed in the anterior chamber. The wounds were found to be water tight. The eye was dressed with Combigan. The patient was given protective glasses to wear throughout the day and a shield with which to sleep tonight. The patient was also given drops with which to begin a drop  regimen today and will follow-up with me in one day. * No implants in log * Procedure(s): CATARACT EXTRACTION PHACO AND INTRAOCULAR LENS PLACEMENT (IOC) RIGHT DIABETIC (Right)  Electronically signed: Galen Manila 01/25/2022 10:41 AM

## 2022-01-25 NOTE — Anesthesia Preprocedure Evaluation (Signed)
Anesthesia Evaluation  Patient identified by MRN, date of birth, ID band Patient awake    Reviewed: Allergy & Precautions, H&P , NPO status , Patient's Chart, lab work & pertinent test results, reviewed documented beta blocker date and time   History of Anesthesia Complications Negative for: history of anesthetic complications  Airway Mallampati: II  TM Distance: >3 FB Neck ROM: full    Dental no notable dental hx. (+) Teeth Intact   Pulmonary neg pulmonary ROS, neg sleep apnea, neg COPD, Patient abstained from smoking.Not current smoker   Pulmonary exam normal breath sounds clear to auscultation       Cardiovascular Exercise Tolerance: Poor METShypertension, On Medications + DOE  (-) CAD and (-) Past MI (-) dysrhythmias  Rhythm:regular Rate:Normal  Patient with increasing DOE; still able to walk around [employer]. She says she has an echo and CT chest coming up . Denies infectious symptoms like fever, cough.   Neuro/Psych negative neurological ROS  negative psych ROS   GI/Hepatic negative GI ROS, Neg liver ROS,neg GERD  ,,  Endo/Other  diabetesHypothyroidism    Renal/GU CRFRenal disease     Musculoskeletal   Abdominal   Peds  Hematology negative hematology ROS (+)   Anesthesia Other Findings Past Medical History: No date: Adult onset vitelliform macular dystrophy No date: Arthritis No date: Chronic kidney disease     Comment:  stage 3 chronic kidney disease No date: Hyperlipidemia No date: Hypertension No date: Hypothyroidism No date: Legally blind No date: Lymphedema No date: Pre-diabetes  Reproductive/Obstetrics negative OB ROS                              Anesthesia Physical Anesthesia Plan  ASA: 3  Anesthesia Plan: MAC   Post-op Pain Management:    Induction: Intravenous  PONV Risk Score and Plan: 2 and Midazolam  Airway Management Planned: Nasal Cannula  Additional  Equipment:   Intra-op Plan:   Post-operative Plan:   Informed Consent: I have reviewed the patients History and Physical, chart, labs and discussed the procedure including the risks, benefits and alternatives for the proposed anesthesia with the patient or authorized representative who has indicated his/her understanding and acceptance.       Plan Discussed with: CRNA  Anesthesia Plan Comments: (Given low risk of anesthetic and procedure, it is reasonable to proceed despite patient being currently worked up for her dyspnea. She denies chest pain or infectious symptoms. She wishes to proceed today. Explained risks of anesthesia, including PONV, and rare emergencies such as cardiac events, respiratory problems, and allergic reactions, requiring invasive intervention. Discussed the role of CRNA in patient's perioperative care. Patient understands. )        Anesthesia Quick Evaluation

## 2022-01-26 ENCOUNTER — Ambulatory Visit
Admission: RE | Admit: 2022-01-26 | Discharge: 2022-01-26 | Disposition: A | Discharge status: Home / Self Care | Payer: Medicare HMO | Source: Ambulatory Visit | Attending: Internal Medicine | Admitting: Internal Medicine

## 2022-01-26 ENCOUNTER — Encounter: Payer: Self-pay | Admitting: Ophthalmology

## 2022-01-26 DIAGNOSIS — R7989 Other specified abnormal findings of blood chemistry: Secondary | ICD-10-CM | POA: Diagnosis not present

## 2022-01-26 DIAGNOSIS — R0602 Shortness of breath: Secondary | ICD-10-CM | POA: Insufficient documentation

## 2022-01-26 MED ORDER — IOHEXOL 350 MG/ML SOLN
100.0000 mL | Freq: Once | INTRAVENOUS | Status: AC | PRN
  Administered 2022-01-26: 85 mL via INTRAVENOUS

## 2022-01-27 DIAGNOSIS — H2511 Age-related nuclear cataract, right eye: Secondary | ICD-10-CM | POA: Diagnosis not present

## 2022-01-28 DIAGNOSIS — R0602 Shortness of breath: Secondary | ICD-10-CM | POA: Diagnosis not present

## 2022-02-02 DIAGNOSIS — I890 Lymphedema, not elsewhere classified: Secondary | ICD-10-CM | POA: Diagnosis not present

## 2022-02-15 DIAGNOSIS — I890 Lymphedema, not elsewhere classified: Secondary | ICD-10-CM | POA: Diagnosis not present

## 2022-03-10 NOTE — H&P (Signed)
[facility]   Primary Care Physician:  Kirk Ruths, MD Ophthalmologist: Dr. George Ina  Pre-Procedure History & Physical: HPI:  Amy Meyer is a 67 y.o. female here for cataract surgery.   Past Medical History:  Diagnosis Date   Adult onset vitelliform macular dystrophy    Arthritis    Chronic kidney disease    stage 3 chronic kidney disease   Hyperlipidemia    Hypertension    Hypothyroidism    Legally blind    Lymphedema    Pre-diabetes     Past Surgical History:  Procedure Laterality Date   BREAST BIOPSY Right    bx/clip-neg   CATARACT EXTRACTION W/PHACO Left 01/11/2022   Procedure: CATARACT EXTRACTION PHACO AND INTRAOCULAR LENS PLACEMENT ([place]) LEFT DIABETIC 5.57 00:37.2;  Surgeon: Birder Robson, MD;  Location: [place];  Service: Ophthalmology;  Laterality: Left;   CATARACT EXTRACTION W/PHACO Right 01/25/2022   Procedure: CATARACT EXTRACTION PHACO AND INTRAOCULAR LENS PLACEMENT ([facility]) RIGHT DIABETIC 4.55 00:23.9;  Surgeon: Birder Robson, MD;  Location: [place];  Service: Ophthalmology;  Laterality: Right;   COLONOSCOPY WITH PROPOFOL N/A 02/13/2017   Procedure: COLONOSCOPY WITH PROPOFOL;  Surgeon: Manya Silvas, MD;  Location: [facility];  Service: Endoscopy;  Laterality: N/A;   TOTAL HIP ARTHROPLASTY Left 09/20/2021   Procedure: TOTAL HIP ARTHROPLASTY;  Surgeon: Dereck Leep, MD;  Location: [facility];  Service: Orthopedics;  Laterality: Left;    Prior to Admission medications   Medication Sig Start Date End Date Taking? Authorizing Provider  aspirin 325 MG tablet Take 325 mg by mouth daily.   Yes [provider]  atorvastatin (LIPITOR) 40 MG tablet Take 40 mg by mouth daily.   Yes [provider]  Ergocalciferol (VITAMIN D2 PO) Take 1,250 mcg by mouth every 14 (fourteen) days. Every 14 days   Yes [provider]  fexofenadine (ALLEGRA) 180 MG tablet Take 180 mg by mouth daily.   Yes  [provider]  hydrochlorothiazide (HYDRODIURIL) 25 MG tablet Take 25 mg by mouth daily.   Yes [provider]  levocetirizine (XYZAL) 5 MG tablet Take 5 mg by mouth every evening.   Yes [provider]  levothyroxine (SYNTHROID, LEVOTHROID) 88 MCG tablet Take 88 mcg by mouth daily before breakfast.   Yes [provider]  triamcinolone cream (KENALOG) 0.1 % Apply 1 Application topically 2 (two) times daily.   Yes [provider]  acetaminophen (TYLENOL) 500 MG tablet Take 1,000 mg by mouth every 8 (eight) hours as needed for mild pain.    [provider]  finasteride (PROSCAR) 5 MG tablet Take 5 mg by mouth daily. 1/2 tablet Patient not taking: Reported on 01/04/2022    [provider]  oxyCODONE (OXY IR/ROXICODONE) 5 MG immediate release tablet Take 1 tablet (5 mg total) by mouth every 4 (four) hours as needed for severe pain. Patient not taking: Reported on 01/04/2022 09/21/21   Tamala Julian B, PA-C    Allergies as of 12/13/2021   (No Known Allergies)    Family History  Problem Relation Age of Onset   Breast cancer Paternal Aunt 64    Social History   Socioeconomic History   Marital status: Married    Spouse name: Renelda Loma   Number of children: Not on file   Years of education: Not on file   Highest education level: Not on file  Occupational History   Not on file  Tobacco Use   Smoking status: Never  Smokeless tobacco: Never  Vaping Use   Vaping Use: Never used  Substance and Sexual Activity   Alcohol use: No   Drug use: No   Sexual activity: Not on file  Other Topics Concern   Not on file  Social History Narrative   Not on file   Social Determinants of Health   Financial Resource Strain: Not on file  Food Insecurity: Not on file  Transportation Needs: Not on file  Physical Activity: Not on file  Stress: Not on file  Social Connections: Not on file  Intimate Partner Violence: Not on file     Review of Systems: See HPI, otherwise negative ROS  Physical Exam: BP (!) 141/63   Pulse 73   Temp 97.8 F (36.6 C)   Resp 18   Ht 5' 2.99" (1.6 m)   Wt 101.2 kg   SpO2 98%   BMI 39.51 kg/m  General:   Alert, cooperative in NAD Head:  Normocephalic and atraumatic. Respiratory:  Normal work of breathing. Cardiovascular:  RRR  Impression/Plan: Amy Meyer is here for cataract surgery.  Risks, benefits, limitations, and alternatives regarding cataract surgery have been reviewed with the patient.  Questions have been answered.  All parties agreeable.   Birder Robson, MD  03/10/2022, 9:52 AM

## 2022-03-30 DIAGNOSIS — H353122 Nonexudative age-related macular degeneration, left eye, intermediate dry stage: Secondary | ICD-10-CM | POA: Diagnosis not present

## 2022-05-05 DIAGNOSIS — Z6841 Body Mass Index (BMI) 40.0 and over, adult: Secondary | ICD-10-CM | POA: Diagnosis not present

## 2022-05-05 DIAGNOSIS — I129 Hypertensive chronic kidney disease with stage 1 through stage 4 chronic kidney disease, or unspecified chronic kidney disease: Secondary | ICD-10-CM | POA: Diagnosis not present

## 2022-05-05 DIAGNOSIS — E1159 Type 2 diabetes mellitus with other circulatory complications: Secondary | ICD-10-CM | POA: Diagnosis not present

## 2022-05-05 DIAGNOSIS — N183 Chronic kidney disease, stage 3 unspecified: Secondary | ICD-10-CM | POA: Diagnosis not present

## 2022-05-05 DIAGNOSIS — E1169 Type 2 diabetes mellitus with other specified complication: Secondary | ICD-10-CM | POA: Diagnosis not present

## 2022-05-05 DIAGNOSIS — E785 Hyperlipidemia, unspecified: Secondary | ICD-10-CM | POA: Diagnosis not present

## 2022-05-05 DIAGNOSIS — E039 Hypothyroidism, unspecified: Secondary | ICD-10-CM | POA: Diagnosis not present

## 2022-05-05 DIAGNOSIS — E1122 Type 2 diabetes mellitus with diabetic chronic kidney disease: Secondary | ICD-10-CM | POA: Diagnosis not present

## 2022-08-22 DIAGNOSIS — E119 Type 2 diabetes mellitus without complications: Secondary | ICD-10-CM | POA: Diagnosis not present

## 2022-08-22 DIAGNOSIS — Z961 Presence of intraocular lens: Secondary | ICD-10-CM | POA: Diagnosis not present

## 2022-09-15 DIAGNOSIS — E1159 Type 2 diabetes mellitus with other circulatory complications: Secondary | ICD-10-CM | POA: Diagnosis not present

## 2022-09-15 DIAGNOSIS — E1169 Type 2 diabetes mellitus with other specified complication: Secondary | ICD-10-CM | POA: Diagnosis not present

## 2022-09-15 DIAGNOSIS — I10 Essential (primary) hypertension: Secondary | ICD-10-CM | POA: Diagnosis not present

## 2022-09-15 DIAGNOSIS — E039 Hypothyroidism, unspecified: Secondary | ICD-10-CM | POA: Diagnosis not present

## 2022-09-15 DIAGNOSIS — E1122 Type 2 diabetes mellitus with diabetic chronic kidney disease: Secondary | ICD-10-CM | POA: Diagnosis not present

## 2022-09-15 DIAGNOSIS — N183 Chronic kidney disease, stage 3 unspecified: Secondary | ICD-10-CM | POA: Diagnosis not present

## 2022-09-15 DIAGNOSIS — E785 Hyperlipidemia, unspecified: Secondary | ICD-10-CM | POA: Diagnosis not present

## 2023-02-13 DIAGNOSIS — Z0000 Encounter for general adult medical examination without abnormal findings: Secondary | ICD-10-CM | POA: Diagnosis not present

## 2023-02-13 DIAGNOSIS — M549 Dorsalgia, unspecified: Secondary | ICD-10-CM | POA: Diagnosis not present

## 2023-02-13 DIAGNOSIS — E1122 Type 2 diabetes mellitus with diabetic chronic kidney disease: Secondary | ICD-10-CM | POA: Diagnosis not present

## 2023-02-13 DIAGNOSIS — E039 Hypothyroidism, unspecified: Secondary | ICD-10-CM | POA: Diagnosis not present

## 2023-02-13 DIAGNOSIS — E1169 Type 2 diabetes mellitus with other specified complication: Secondary | ICD-10-CM | POA: Diagnosis not present

## 2023-02-13 DIAGNOSIS — Z1331 Encounter for screening for depression: Secondary | ICD-10-CM | POA: Diagnosis not present

## 2023-02-13 DIAGNOSIS — I700 Atherosclerosis of aorta: Secondary | ICD-10-CM | POA: Diagnosis not present

## 2023-02-13 DIAGNOSIS — I152 Hypertension secondary to endocrine disorders: Secondary | ICD-10-CM | POA: Diagnosis not present

## 2023-02-13 DIAGNOSIS — N183 Chronic kidney disease, stage 3 unspecified: Secondary | ICD-10-CM | POA: Diagnosis not present

## 2023-02-13 DIAGNOSIS — M51369 Other intervertebral disc degeneration, lumbar region without mention of lumbar back pain or lower extremity pain: Secondary | ICD-10-CM | POA: Diagnosis not present

## 2023-02-13 DIAGNOSIS — E1159 Type 2 diabetes mellitus with other circulatory complications: Secondary | ICD-10-CM | POA: Diagnosis not present

## 2023-02-28 ENCOUNTER — Other Ambulatory Visit: Payer: Self-pay | Admitting: Internal Medicine

## 2023-02-28 DIAGNOSIS — Z1231 Encounter for screening mammogram for malignant neoplasm of breast: Secondary | ICD-10-CM

## 2023-03-08 ENCOUNTER — Ambulatory Visit
Admission: RE | Admit: 2023-03-08 | Discharge: 2023-03-08 | Disposition: A | Discharge status: Home / Self Care | Payer: Medicare HMO | Source: Ambulatory Visit | Attending: Internal Medicine | Admitting: Internal Medicine

## 2023-03-08 DIAGNOSIS — Z1231 Encounter for screening mammogram for malignant neoplasm of breast: Secondary | ICD-10-CM | POA: Insufficient documentation

## 2023-08-05 IMAGING — MG MM DIGITAL SCREENING BILAT W/ TOMO AND CAD
8 series · 8 of 24 positions shown · non-contrast
Comparison: Previous exam(s).

CLINICAL DATA: Screening.

EXAM:
DIGITAL SCREENING BILATERAL MAMMOGRAM WITH TOMOSYNTHESIS AND CAD
TECHNIQUE: Bilateral screening digital craniocaudal and mediolateral oblique
mammograms were obtained. Bilateral screening digital breast
tomosynthesis was performed. The images were evaluated with
computer-aided detection.

[R MLO synth-2D]
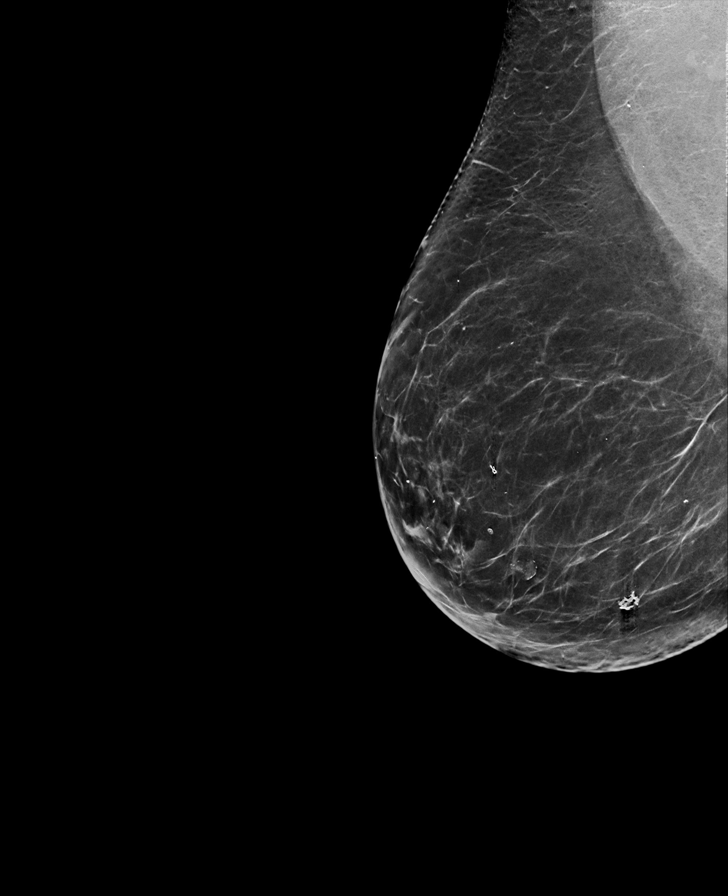

[L MLO synth-2D]
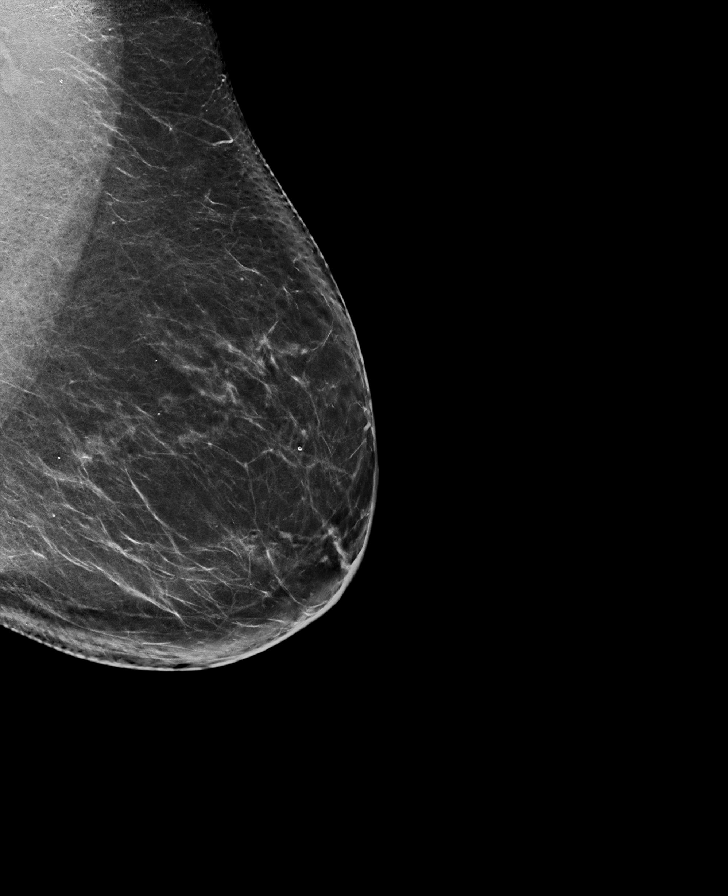

[L CC synth-2D]
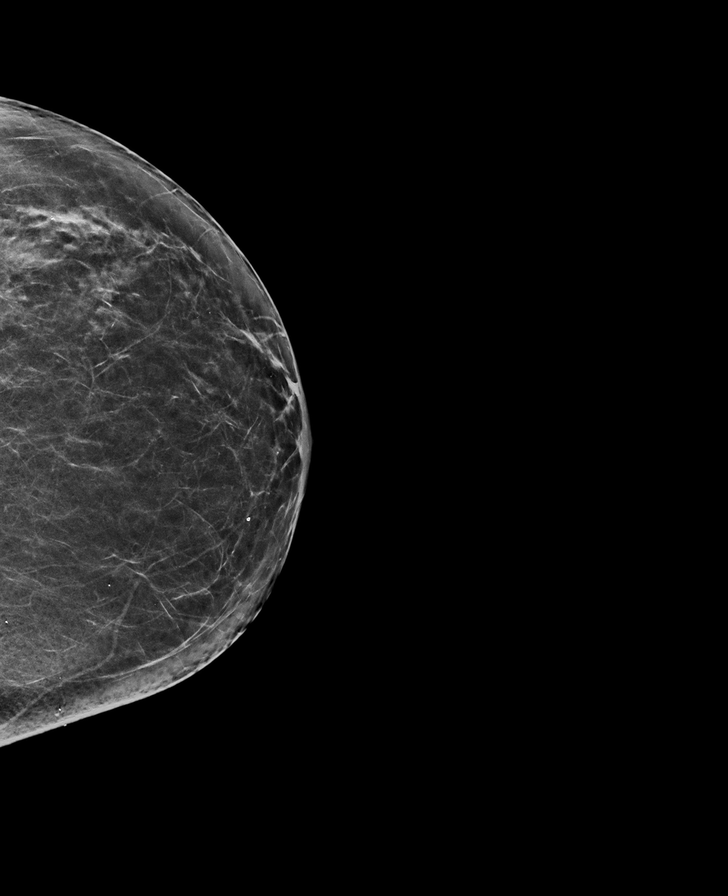

[R CC synth-2D]
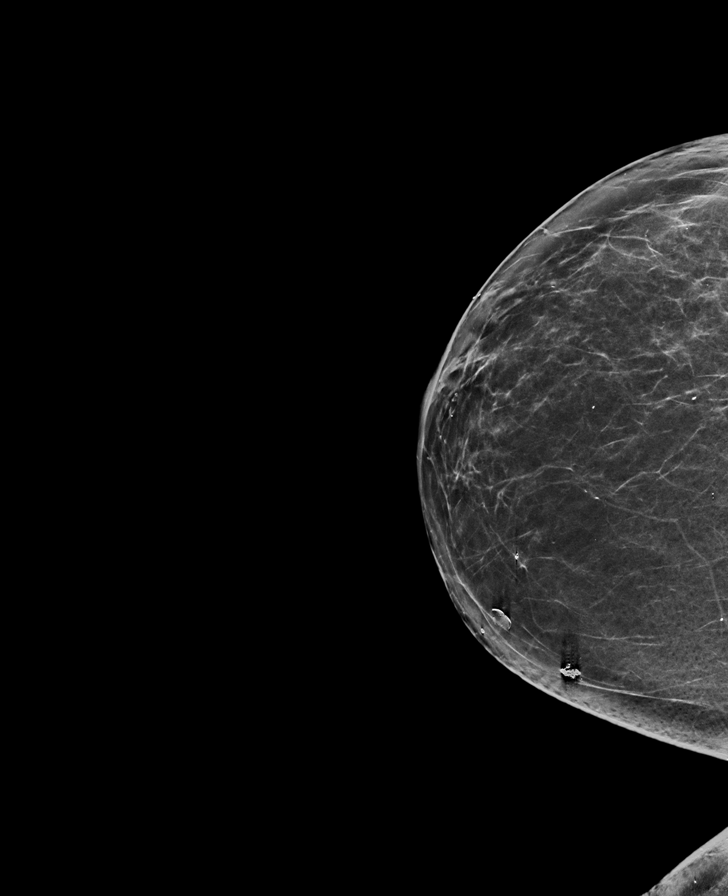

[R MLO tomo · tomo slice 41/82.0]
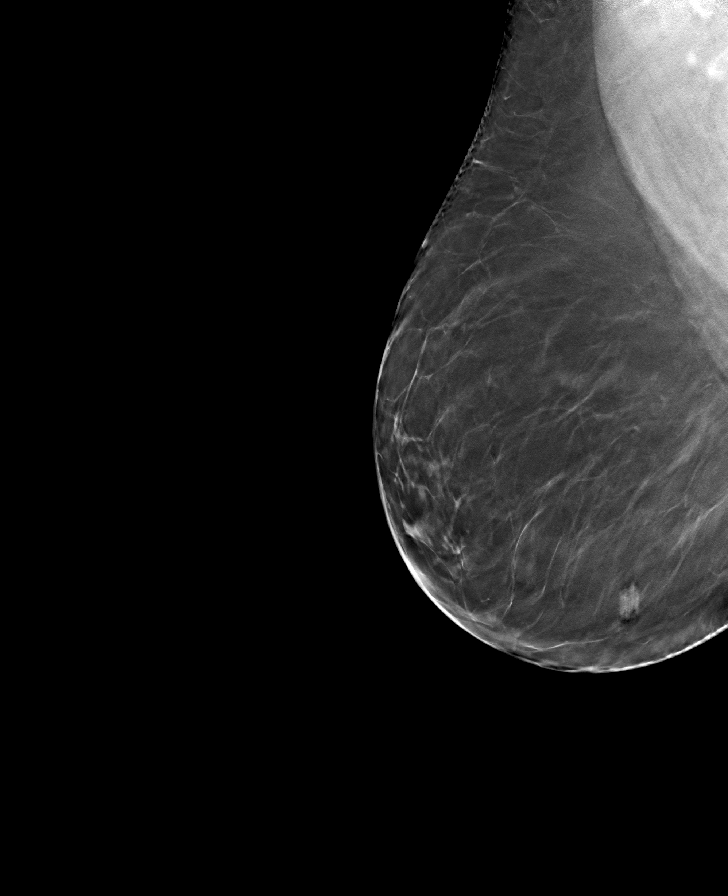

[L CC tomo · tomo slice 33/66.0]
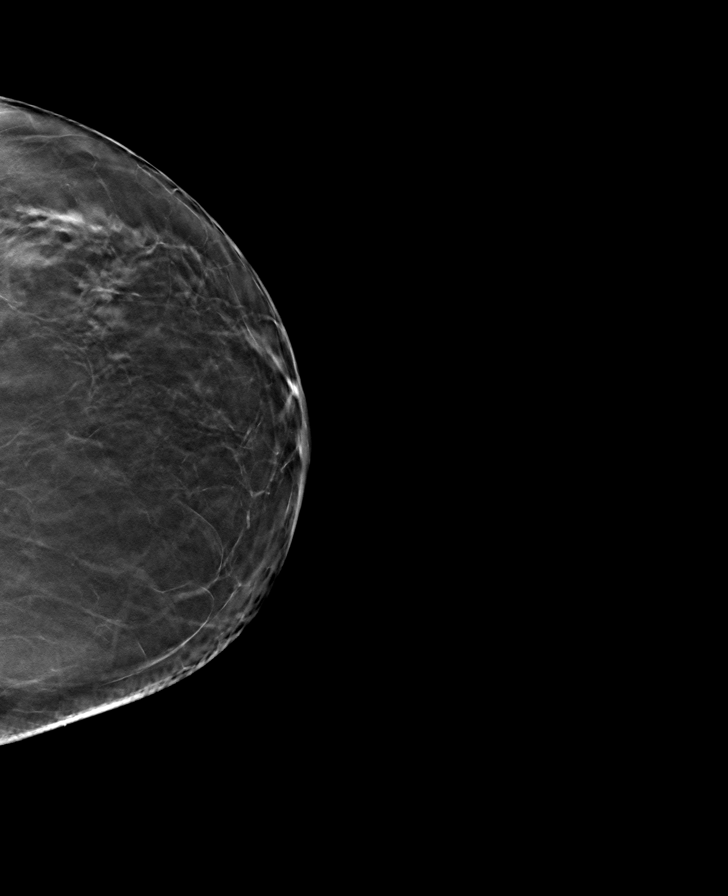

[R CC tomo · tomo slice 34/67.0]
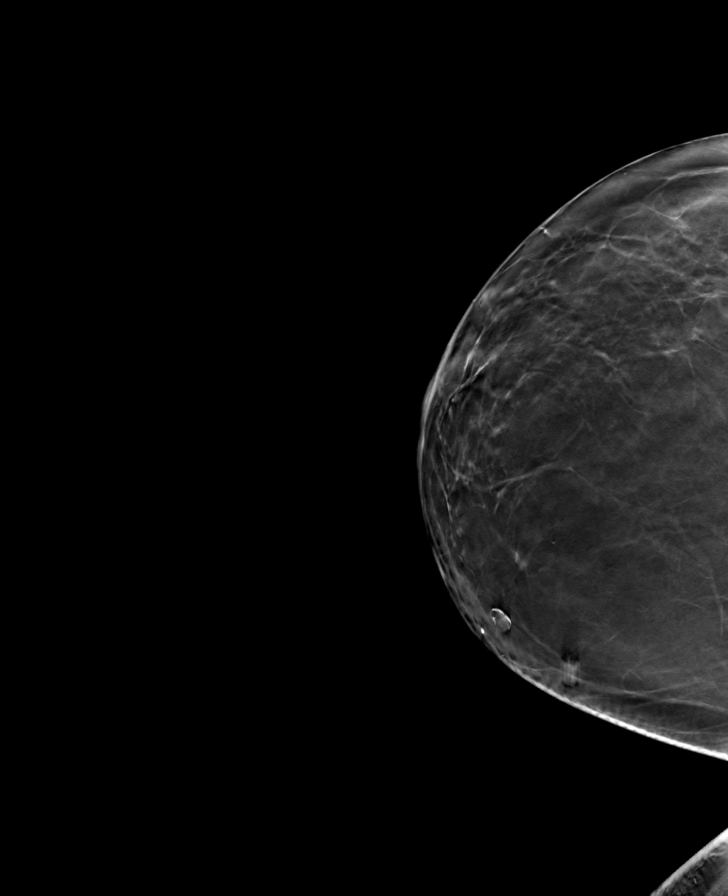

[L MLO tomo · tomo slice 42/83.0]
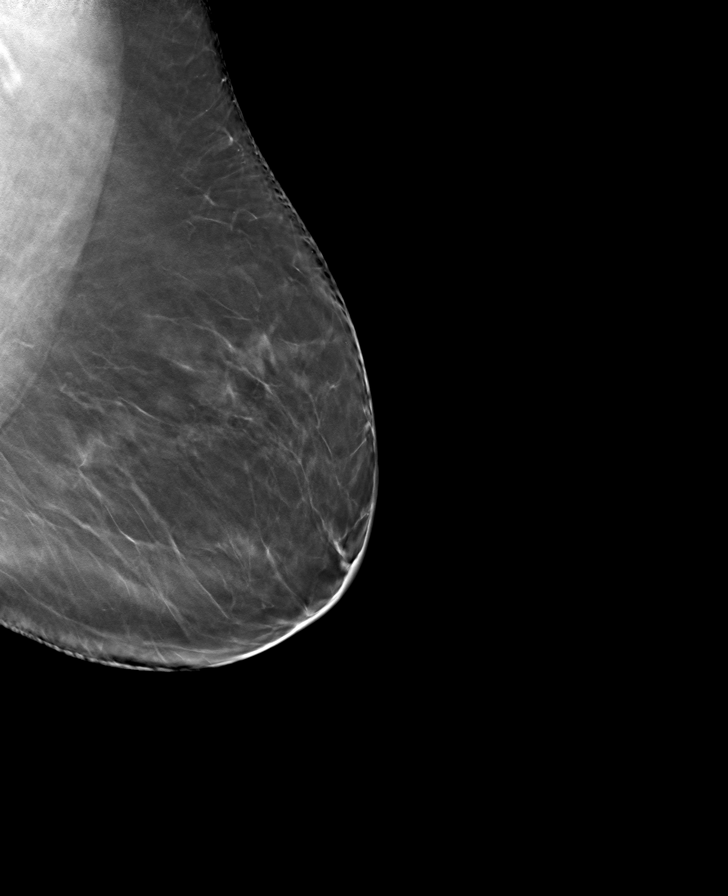

[8 of 24 positions shown; findings below may reference images not displayed]

ACR Breast Density Category b: There are scattered areas of
fibroglandular density.
FINDINGS: There are no findings suspicious for malignancy.
IMPRESSION: No mammographic evidence of malignancy. A result letter of this
screening mammogram will be mailed directly to the patient.

RECOMMENDATION:
Screening mammogram in one year. (Code:51-O-LD2)

BI-RADS CATEGORY  1: Negative.

## 2023-08-14 DIAGNOSIS — E785 Hyperlipidemia, unspecified: Secondary | ICD-10-CM | POA: Diagnosis not present

## 2023-08-14 DIAGNOSIS — E1122 Type 2 diabetes mellitus with diabetic chronic kidney disease: Secondary | ICD-10-CM | POA: Diagnosis not present

## 2023-08-14 DIAGNOSIS — I152 Hypertension secondary to endocrine disorders: Secondary | ICD-10-CM | POA: Diagnosis not present

## 2023-08-14 DIAGNOSIS — E1169 Type 2 diabetes mellitus with other specified complication: Secondary | ICD-10-CM | POA: Diagnosis not present

## 2023-08-14 DIAGNOSIS — N183 Chronic kidney disease, stage 3 unspecified: Secondary | ICD-10-CM | POA: Diagnosis not present

## 2023-08-14 DIAGNOSIS — E039 Hypothyroidism, unspecified: Secondary | ICD-10-CM | POA: Diagnosis not present

## 2023-08-14 DIAGNOSIS — E1159 Type 2 diabetes mellitus with other circulatory complications: Secondary | ICD-10-CM | POA: Diagnosis not present

## 2024-01-25 ENCOUNTER — Other Ambulatory Visit: Payer: Self-pay | Admitting: Internal Medicine

## 2024-01-25 DIAGNOSIS — Z1231 Encounter for screening mammogram for malignant neoplasm of breast: Secondary | ICD-10-CM

## 2024-03-14 ENCOUNTER — Ambulatory Visit
Admission: RE | Admit: 2024-03-14 | Discharge: 2024-03-14 | Disposition: A | Discharge status: Home / Self Care | Source: Ambulatory Visit | Attending: Internal Medicine | Admitting: Internal Medicine

## 2024-03-14 DIAGNOSIS — Z1231 Encounter for screening mammogram for malignant neoplasm of breast: Secondary | ICD-10-CM | POA: Insufficient documentation
